# Patient Record
Sex: Female | Born: 1995
Health system: Southern US, Community
[De-identification: ages and names within clinical notes are randomized; demographics above are authoritative.]

## PROBLEM LIST (undated history)

## (undated) DIAGNOSIS — G43009 Migraine without aura, not intractable, without status migrainosus: Secondary | ICD-10-CM

## (undated) DIAGNOSIS — T753XXA Motion sickness, initial encounter: Secondary | ICD-10-CM

## (undated) DIAGNOSIS — L709 Acne, unspecified: Secondary | ICD-10-CM

## (undated) DIAGNOSIS — G44309 Post-traumatic headache, unspecified, not intractable: Secondary | ICD-10-CM

## (undated) HISTORY — DX: Acne, unspecified: L70.9

## (undated) HISTORY — PX: NO PAST SURGERIES: SHX2092

## (undated) HISTORY — DX: Migraine without aura, not intractable, without status migrainosus: G43.009

---
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
## 2014-03-01 ENCOUNTER — Encounter: Payer: Self-pay | Admitting: Family Medicine

## 2014-03-01 ENCOUNTER — Ambulatory Visit (INDEPENDENT_AMBULATORY_CARE_PROVIDER_SITE_OTHER): Payer: 59 | Admitting: Family Medicine

## 2014-03-01 VITALS — BP 118/78 | HR 89 | Temp 98.1°F | Ht 65.0 in | Wt 145.8 lb

## 2014-03-01 DIAGNOSIS — J0110 Acute frontal sinusitis, unspecified: Secondary | ICD-10-CM

## 2014-03-01 DIAGNOSIS — J302 Other seasonal allergic rhinitis: Secondary | ICD-10-CM

## 2014-03-01 DIAGNOSIS — G43009 Migraine without aura, not intractable, without status migrainosus: Secondary | ICD-10-CM | POA: Insufficient documentation

## 2014-03-01 DIAGNOSIS — F430 Acute stress reaction: Secondary | ICD-10-CM

## 2014-03-01 DIAGNOSIS — F410 Panic disorder [episodic paroxysmal anxiety] without agoraphobia: Secondary | ICD-10-CM

## 2014-03-01 HISTORY — DX: Migraine without aura, not intractable, without status migrainosus: G43.009

## 2014-03-01 MED ORDER — FLUCONAZOLE 150 MG PO TABS: 150.0000 mg | ORAL_TABLET | Freq: Once | ORAL | Status: DC

## 2014-03-01 MED ORDER — AMOXICILLIN-POT CLAVULANATE 875-125 MG PO TABS: 1.0000 | ORAL_TABLET | Freq: Two times a day (BID) | ORAL | Status: DC

## 2014-03-01 NOTE — Progress Notes (Signed)
Pre visit review using our clinic review tool, if applicable. No additional management support is needed unless otherwise documented below in the visit note. 

## 2014-03-01 NOTE — Progress Notes (Signed)
Dr. Karleen HampshireSpencer T. Tipton Ballow, MD, CAQ Sports Medicine [facility] Sports Medicine [address] [place], [id number] Phone: [phone] Fax: [phone]  03/01/2014  Patient: Julie CiproLeighann M Mckenzie, MRN: [id number], DOB: 08/16/1995, [age] y.o.  Primary Physician:  Kerby NoraAmy Bedsole, MD  Chief Complaint: Sinusitis and Ear Pain  Subjective:   Julie Mckenzie is a 18 y.o. very pleasant female patient who presents with the following:  [facility] [employer].  Sophomore.   [facility], and has gotten a lot of sinus infections and ear infections years ago and has had some bad eye paini and ear pain. Close to 2 months.  Missed 3 days in October. Ears were hurting really bad. Mainly frontal head pain.   Also on topamax for migraines.  Gets a little bit of school anxiety. A lot to do in a week, will get short of breath and panicky.   Not other times. No depression.  Past Medical History, Surgical History, Social History, Family History, Problem List, Medications, and Allergies have been reviewed and updated if relevant.  ROS: GEN: Acute illness details above GI: Tolerating PO intake GU: maintaining adequate hydration and urination Pulm: No SOB Interactive and getting along well at home.  Otherwise, ROS is as per the HPI.   Objective:   BP 118/78 mmHg  Pulse 89  Temp(Src) 98.1 F (36.7 C) (Oral)  Ht 5\' 5"  (1.651 m)  Wt 145 lb 12 oz (66.112 kg)  BMI 24.25 kg/m2  LMP 02/17/2014   Gen: WDWN, NAD; alert,appropriate and cooperative throughout exam  HEENT: Normocephalic and atraumatic. Throat clear, w/o exudate, no LAD, R TM clear, serous fluid, L TM - good landmarks, + fluid present. rhinnorhea.  Left frontal and maxillary sinuses: Tender, mild at front Right frontal and maxillary sinuses: Tender, mild at front  Neck: No ant or post LAD CV: RRR, No M/G/R Pulm: Breathing comfortably in no resp distress. no w/c/r Abd: S,NT,ND,+BS Extr: no c/c/e Psych: full  affect, pleasant    Laboratory and Imaging Data:  Assessment and Plan:   Acute frontal sinusitis, recurrence not specified  Seasonal allergies  Migraine without aura and without status migrainosus, not intractable  Panic attack as reaction to stress  With 3 months of intermittent symptoms, ALLERGIES, and intermittent cold symptoms, I think that it is reasonable with the frontal headache and pain in the frontal sinus reason to treat this.  Possible this could be a migraine variant.  ALLERGIES are Arty pretty well treated with Zyrtec and she is also use some steroids nasally.  She has had an occasional minor panic attack.  She wanted some advice about how to deal with these without medication.  We talked about regular exercise, regular routine aerobic exercise, some breathing techniques, and some other basic cognitive behavioral therapy techniques to deal with anxiety and panic attacks.  I do not think she has true panic disorder.  Follow-up: No Follow-up on file.  New Prescriptions   AMOXICILLIN-CLAVULANATE (AUGMENTIN) 875-125 MG PER TABLET    Take 1 tablet by mouth 2 (two) times daily.   FLUCONAZOLE (DIFLUCAN) 150 MG TABLET    Take 1 tablet (150 mg total) by mouth once. Repeat if needed   No orders of the defined types were placed in this encounter.    Signed,  Elpidio GaleaSpencer T. Alisah Grandberry, MD   Patient's Medications  New Prescriptions   AMOXICILLIN-CLAVULANATE (AUGMENTIN) 875-125 MG PER TABLET    Take 1 tablet by mouth 2 (  two) times daily.   FLUCONAZOLE (DIFLUCAN) 150 MG TABLET    Take 1 tablet (150 mg total) by mouth once. Repeat if needed  Previous Medications   CETIRIZINE (ZYRTEC) 10 MG TABLET    Take 10 mg by mouth daily.   IBUPROFEN (ADVIL,MOTRIN) 200 MG TABLET    Take 600 mg by mouth every 6 (six) hours as needed for pain.   NORGESTIMATE-ETHINYL ESTRADIOL (ORTHO-CYCLEN, 28,) 0.25-35 MG-MCG TABLET    TAKE 1 TABLET BY MOUTH DAILY   TOPIRAMATE (TOPAMAX) 25 MG CAPSULE    Take 3  capsules (75 mg total) by mouth 2 (two) times daily.  Modified Medications   No medications on file  Discontinued Medications   No medications on file

## 2014-05-06 ENCOUNTER — Other Ambulatory Visit: Payer: Self-pay

## 2014-05-06 MED ORDER — TOPIRAMATE 25 MG PO CPSP: 75.0000 mg | ORAL_CAPSULE | Freq: Two times a day (BID) | ORAL | Status: DC

## 2014-05-06 NOTE — Telephone Encounter (Signed)
pts mother left v/m requesting refill topamax for migraine h/as to [facility] [address] Pt has not seen neurologist in 2 years. pts last annual exam 08/27/13. Pt is out of med and pts mother will see her at college this weekend. Please advise.

## 2014-09-02 ENCOUNTER — Ambulatory Visit (INDEPENDENT_AMBULATORY_CARE_PROVIDER_SITE_OTHER): Payer: 59 | Admitting: Family Medicine

## 2014-09-02 ENCOUNTER — Ambulatory Visit (INDEPENDENT_AMBULATORY_CARE_PROVIDER_SITE_OTHER)
Admission: RE | Admit: 2014-09-02 | Discharge: 2014-09-02 | Disposition: A | Discharge status: Home / Self Care | Payer: 59 | Source: Ambulatory Visit | Attending: Family Medicine | Admitting: Family Medicine

## 2014-09-02 ENCOUNTER — Encounter: Payer: Self-pay | Admitting: Family Medicine

## 2014-09-02 VITALS — BP 124/80 | HR 80 | Temp 98.3°F | Ht 65.0 in | Wt 138.0 lb

## 2014-09-02 DIAGNOSIS — W19XXXA Unspecified fall, initial encounter: Secondary | ICD-10-CM | POA: Insufficient documentation

## 2014-09-02 DIAGNOSIS — M545 Low back pain, unspecified: Secondary | ICD-10-CM | POA: Insufficient documentation

## 2014-09-02 DIAGNOSIS — M533 Sacrococcygeal disorders, not elsewhere classified: Secondary | ICD-10-CM

## 2014-09-02 DIAGNOSIS — Y92099 Unspecified place in other non-institutional residence as the place of occurrence of the external cause: Secondary | ICD-10-CM | POA: Diagnosis not present

## 2014-09-02 DIAGNOSIS — Y92009 Unspecified place in unspecified non-institutional (private) residence as the place of occurrence of the external cause: Principal | ICD-10-CM

## 2014-09-02 NOTE — Patient Instructions (Signed)
We will call with x-ray results.  Sit on donut pillow to avoid pressure when able.  Start ibuprofen with food 800 mg every 8 hours for pain and inflammation.

## 2014-09-02 NOTE — Progress Notes (Signed)
Pre visit review using our clinic review tool, if applicable. No additional management support is needed unless otherwise documented below in the visit note. 

## 2014-09-02 NOTE — Progress Notes (Signed)
   Subjective:    Patient ID: Julie Mckenzie, female    DOB: 05/03/1995, 19 y.o.   MRN: [id number]  HPI  19 year old female presents following  Fall off folding lunge chair. She landed directly on her tailbone 3 days ago.  At first mild pain, since then she has had worsening pain.  Low back pain , left hip and both legs. Pain severe if try o sit directly on tailbone. Pain with moving around.  She has used ibuprofen 400 mg BID, helps minimally.  No bruising.   No numbness, no weakness in legs.   PMH: no back issues.    Review of Systems  Constitutional: Negative for fever and fatigue.  HENT: Negative for ear pain.   Eyes: Negative for pain.  Respiratory: Negative for chest tightness and shortness of breath.   Cardiovascular: Negative for chest pain, palpitations and leg swelling.  Gastrointestinal: Negative for abdominal pain.  Genitourinary: Negative for dysuria.       Objective:   Physical Exam  Constitutional: Vital signs are normal. She appears well-developed and well-nourished. She is cooperative.  Non-toxic appearance. She does not appear ill. No distress.  HENT:  Head: Normocephalic.  Right Ear: Hearing, tympanic membrane, external ear and ear canal normal. Tympanic membrane is not erythematous, not retracted and not bulging.  Left Ear: Hearing, tympanic membrane, external ear and ear canal normal. Tympanic membrane is not erythematous, not retracted and not bulging.  Nose: No mucosal edema or rhinorrhea. Right sinus exhibits no maxillary sinus tenderness and no frontal sinus tenderness. Left sinus exhibits no maxillary sinus tenderness and no frontal sinus tenderness.  Mouth/Throat: Uvula is midline, oropharynx is clear and moist and mucous membranes are normal.  Eyes: Conjunctivae, EOM and lids are normal. Pupils are equal, round, and reactive to light. Lids are everted and swept, no foreign bodies found.  Neck: Trachea normal and normal range of motion. Neck supple.  Carotid bruit is not present. No thyroid mass and no thyromegaly present.  Cardiovascular: Normal rate, regular rhythm, S1 normal, S2 normal, normal heart sounds, intact distal pulses and normal pulses.  Exam reveals no gallop and no friction rub.   No murmur heard. Pulmonary/Chest: Effort normal and breath sounds normal. No tachypnea. No respiratory distress. She has no decreased breath sounds. She has no wheezes. She has no rhonchi. She has no rales.  Abdominal: Soft. Normal appearance and bowel sounds are normal. There is no tenderness.  Musculoskeletal:       Right hip: Normal.       Left hip: Normal.       Cervical back: Normal. She exhibits normal range of motion.       Thoracic back: Normal.       Lumbar back: She exhibits tenderness. She exhibits normal range of motion.  ttp over coccyx, no other focal pain  Neurological: She is alert.  Skin: Skin is warm, dry and intact. No rash noted.  Psychiatric: Her speech is normal and behavior is normal. Judgment and thought content normal. Her mood appears not anxious. Cognition and memory are normal. She does not exhibit a depressed mood.          Assessment & Plan:

## 2014-09-05 ENCOUNTER — Other Ambulatory Visit: Payer: Self-pay | Admitting: Family Medicine

## 2014-09-06 NOTE — Telephone Encounter (Signed)
Pt left v/m requesting status of BC refill; left v/m per DPR that refill was sent to [facility] [address]

## 2014-09-06 NOTE — Telephone Encounter (Signed)
Last office visit 09/02/2014 for fall.  Last CPE 08/24/2012.  Last refilled 09/29/2013 for #28 with 11 refills.  Ok to refill?

## 2014-09-08 ENCOUNTER — Encounter: Payer: Self-pay | Admitting: Family Medicine

## 2014-09-08 NOTE — Assessment & Plan Note (Signed)
Given fall and low  Back pain. Eval with X-ray; no fracture seen.

## 2014-09-08 NOTE — Assessment & Plan Note (Signed)
Eval with X-ray.. Initial read showed no fracture. Use NSAIDs ., donought pillow and limit physical activity

## 2014-09-30 ENCOUNTER — Other Ambulatory Visit: Payer: Self-pay

## 2014-09-30 MED ORDER — TOPIRAMATE 25 MG PO CPSP: 75.0000 mg | ORAL_CAPSULE | Freq: Two times a day (BID) | ORAL | Status: DC

## 2014-09-30 MED ORDER — NORGESTIMATE-ETH ESTRADIOL 0.25-35 MG-MCG PO TABS: 1.0000 | ORAL_TABLET | Freq: Every day | ORAL | Status: DC

## 2014-09-30 NOTE — Telephone Encounter (Signed)
Pt left v/m requesting refill ortho cyclen and topamax until seen for CPX on 11/17/14.Please advise.

## 2014-10-01 ENCOUNTER — Other Ambulatory Visit: Payer: Self-pay | Admitting: Family Medicine

## 2014-11-17 ENCOUNTER — Ambulatory Visit (INDEPENDENT_AMBULATORY_CARE_PROVIDER_SITE_OTHER): Payer: 59 | Admitting: Family Medicine

## 2014-11-17 ENCOUNTER — Encounter: Payer: Self-pay | Admitting: Family Medicine

## 2014-11-17 VITALS — BP 124/70 | HR 61 | Temp 98.0°F | Ht 65.0 in | Wt 139.0 lb

## 2014-11-17 DIAGNOSIS — Z0000 Encounter for general adult medical examination without abnormal findings: Secondary | ICD-10-CM

## 2014-11-17 DIAGNOSIS — Z113 Encounter for screening for infections with a predominantly sexual mode of transmission: Secondary | ICD-10-CM | POA: Diagnosis not present

## 2014-11-17 MED ORDER — NORGESTIMATE-ETH ESTRADIOL 0.25-35 MG-MCG PO TABS: 1.0000 | ORAL_TABLET | Freq: Every day | ORAL | Status: DC

## 2014-11-17 MED ORDER — TOPIRAMATE 25 MG PO CPSP: 75.0000 mg | ORAL_CAPSULE | Freq: Every day | ORAL | Status: DC

## 2014-11-17 NOTE — Progress Notes (Signed)
   Subjective:    Patient ID: Julie Mckenzie, female    DOB: 1995/05/12, 19 y.o.   MRN: [id number]  HPI  The patient is here for annual wellness exam and preventative care.     She has concerns about being tested for STDs. She has one boyfriend in last year.  Over 1 year ago she was diagnosed and treated for chlamydia.  She is using orthocyclen, menses routine and no issues.  Migraine; well controlled on topiramate.  Acne, improved off accutane. Already on low androgenic SE OCP.   Diet:  Eating more healthy. More fruits and veggies. No fast food. Drink water. Uses [employer]itness Pal.  Exercise: 7 days a week walking/ running, weight lifting.     Review of Systems  Constitutional: Negative for fever and fatigue.  HENT: Negative for ear pain.   Eyes: Negative for pain.  Respiratory: Negative for cough and shortness of breath.   Cardiovascular: Negative for chest pain.  Gastrointestinal: Negative for abdominal pain.  Genitourinary: Negative for dysuria, vaginal pain, menstrual problem and pelvic pain.  Psychiatric/Behavioral: Negative for hallucinations, dysphoric mood and agitation. The patient is not nervous/anxious.    No vaginal ulcers.    Objective:   Physical Exam  Constitutional: Vital signs are normal. She appears well-developed and well-nourished. She is cooperative.  Non-toxic appearance. She does not appear ill. No distress.  HENT:  Head: Normocephalic.  Right Ear: Hearing, tympanic membrane, external ear and ear canal normal.  Left Ear: Hearing, tympanic membrane, external ear and ear canal normal.  Nose: Nose normal.  Eyes: Conjunctivae, EOM and lids are normal. Pupils are equal, round, and reactive to light. Lids are everted and swept, no foreign bodies found.  Neck: Trachea normal and normal range of motion. Neck supple. Carotid bruit is not present. No thyroid mass and no thyromegaly present.  Cardiovascular: Normal rate, regular rhythm, S1 normal, S2 normal,  normal heart sounds and intact distal pulses.  Exam reveals no gallop.   No murmur heard. Pulmonary/Chest: Effort normal and breath sounds normal. No respiratory distress. She has no wheezes. She has no rhonchi. She has no rales.  Abdominal: Soft. Normal appearance and bowel sounds are normal. She exhibits no distension, no fluid wave, no abdominal bruit and no mass. There is no hepatosplenomegaly. There is no tenderness. There is no rebound, no guarding and no CVA tenderness. No hernia.  Lymphadenopathy:    She has no cervical adenopathy.    She has no axillary adenopathy.  Neurological: She is alert. She has normal strength. No cranial nerve deficit or sensory deficit.  Skin: Skin is warm, dry and intact. No rash noted.  Psychiatric: Her speech is normal and behavior is normal. Judgment normal. Her mood appears not anxious. Cognition and memory are normal. She does not exhibit a depressed mood.          Assessment & Plan:  The patient's preventative maintenance and recommended screening tests for an annual wellness exam were reviewed in full today. Brought up to date unless services declined.  Counselled on the importance of diet, exercise, and its role in overall health and mortality. The patient's FH and SH was reviewed, including their home life, tobacco status, and drug and alcohol status.   Nonsmoker  STD testing: due  Immunizations: Uptodate. PAP/DVE:  Will start at age 2.

## 2014-11-17 NOTE — Addendum Note (Signed)
Addended by: Shon Millet on: 11/17/2014 02:35 PM   Modules accepted: Orders

## 2014-11-17 NOTE — Progress Notes (Signed)
Pre visit review using our clinic review tool, if applicable. No additional management support is needed unless otherwise documented below in the visit note. 

## 2014-11-17 NOTE — Patient Instructions (Signed)
Stop at lab on way out for testing.  Keep up great work on healthy eating and regular exercise.

## 2014-11-18 LAB — HEPATITIS PANEL, ACUTE
HCV Ab: NEGATIVE
HEP A IGM: NONREACTIVE
Hep B C IgM: NONREACTIVE
Hepatitis B Surface Ag: NEGATIVE

## 2014-11-18 LAB — GC/CHLAMYDIA PROBE AMP
CT Probe RNA: NEGATIVE
GC Probe RNA: NEGATIVE

## 2014-11-18 LAB — HIV ANTIBODY (ROUTINE TESTING W REFLEX): HIV 1&2 Ab, 4th Generation: NONREACTIVE

## 2014-11-19 LAB — RPR

## 2015-03-16 ENCOUNTER — Other Ambulatory Visit: Payer: Self-pay | Admitting: Family Medicine

## 2015-04-11 ENCOUNTER — Ambulatory Visit (INDEPENDENT_AMBULATORY_CARE_PROVIDER_SITE_OTHER): Payer: 59 | Admitting: Family Medicine

## 2015-04-11 ENCOUNTER — Encounter: Payer: Self-pay | Admitting: Family Medicine

## 2015-04-11 VITALS — BP 100/70 | HR 76 | Temp 98.4°F | Wt 138.8 lb

## 2015-04-11 DIAGNOSIS — F4329 Adjustment disorder with other symptoms: Secondary | ICD-10-CM | POA: Diagnosis not present

## 2015-04-11 DIAGNOSIS — G43009 Migraine without aura, not intractable, without status migrainosus: Secondary | ICD-10-CM | POA: Diagnosis not present

## 2015-04-11 MED ORDER — TOPIRAMATE 100 MG PO TABS: 100.0000 mg | ORAL_TABLET | Freq: Two times a day (BID) | ORAL | Status: DC

## 2015-04-11 MED ORDER — SUMATRIPTAN SUCCINATE 100 MG PO TABS: 100.0000 mg | ORAL_TABLET | ORAL | Status: DC | PRN

## 2015-04-11 NOTE — Progress Notes (Signed)
Pre visit review using our clinic review tool, if applicable. No additional management support is needed unless otherwise documented below in the visit note. 

## 2015-04-11 NOTE — Assessment & Plan Note (Signed)
Increase topiramate to 100 mg daily. Keep headache diary. Continue healthy lifestyle. Sumatriptan for acute treatment.   Given stress trigger.. Work on stress reduction.

## 2015-04-11 NOTE — Patient Instructions (Signed)
Increase Topamax to 100 mg tab daily.  Use sumatriptan for acute treatment.  Keep a headache diary.  Work on stress reduction, relaxation.  Follow up in 1 month for migraines and mood.

## 2015-04-11 NOTE — Progress Notes (Signed)
Subjective:    Patient ID: Julie Mckenzie, female    DOB: [birth year], 20 y.o.   MRN: [id number]  HPI  20 year old female presents to discuss migraines.   She reports she  Is currently taking Topamax 75 mg daily for prevention.  She has an increase in stress and anxiety at school the last semester.  She has had more severe and more frequent headaches.  Occurring once a week, lasting 1-2 days.  She works out every day, eats healthy, lots a of water. Good sleep.  With headache gets a lot of neck pain and tension. Has sensitivity to light and sound, Mild nausea.   Wt Readings from Last 3 Encounters:  04/11/15 138 lb 12 oz (62.937 kg)  11/17/14 139 lb (63.05 kg)  09/02/14 138 lb (62.596 kg)   GAD 7; score 6 , somewhat difficult.  Seen counselor.     Review of Systems  Constitutional: Negative for fever and fatigue.  HENT: Negative for ear pain.   Eyes: Negative for pain.  Respiratory: Negative for chest tightness and shortness of breath.   Cardiovascular: Negative for chest pain, palpitations and leg swelling.  Gastrointestinal: Negative for abdominal pain.  Genitourinary: Negative for dysuria.       Objective:   Physical Exam  Constitutional: She is oriented to person, place, and time. Vital signs are normal. She appears well-developed and well-nourished. She is cooperative.  Non-toxic appearance. She does not appear ill. No distress.  HENT:  Head: Normocephalic.  Right Ear: Hearing, tympanic membrane, external ear and ear canal normal. Tympanic membrane is not erythematous, not retracted and not bulging.  Left Ear: Hearing, tympanic membrane, external ear and ear canal normal. Tympanic membrane is not erythematous, not retracted and not bulging.  Nose: No mucosal edema or rhinorrhea. Right sinus exhibits no maxillary sinus tenderness and no frontal sinus tenderness. Left sinus exhibits no maxillary sinus tenderness and no frontal sinus tenderness.  Mouth/Throat: Uvula  is midline, oropharynx is clear and moist and mucous membranes are normal.  Eyes: Conjunctivae, EOM and lids are normal. Pupils are equal, round, and reactive to light. Lids are everted and swept, no foreign bodies found.  Neck: Trachea normal and normal range of motion. Neck supple. Carotid bruit is not present. No thyroid mass and no thyromegaly present.  Cardiovascular: Normal rate, regular rhythm, S1 normal, S2 normal, normal heart sounds, intact distal pulses and normal pulses.  Exam reveals no gallop and no friction rub.   No murmur heard. Pulmonary/Chest: Effort normal and breath sounds normal. No tachypnea. No respiratory distress. She has no decreased breath sounds. She has no wheezes. She has no rhonchi. She has no rales.  Abdominal: Soft. Normal appearance and bowel sounds are normal. There is no tenderness.  Neurological: She is alert and oriented to person, place, and time. She has normal strength and normal reflexes. No cranial nerve deficit or sensory deficit. She exhibits normal muscle tone. She displays a negative Romberg sign. Coordination and gait normal. GCS eye subscore is 4. GCS verbal subscore is 5. GCS motor subscore is 6.  Nml cerebellar exam   No papilledema  Skin: Skin is warm, dry and intact. No rash noted.  Psychiatric: She has a normal mood and affect. Her speech is normal and behavior is normal. Judgment and thought content normal. Her mood appears not anxious. Cognition and memory are normal. Cognition and memory are not impaired. She does not exhibit a depressed mood. She exhibits normal recent memory  and normal remote memory.          Assessment & Plan:

## 2015-04-11 NOTE — Assessment & Plan Note (Signed)
GAD score fairly low at 6.  Work on relaxation and stress reduction. Follow up in 1 month.

## 2015-05-02 ENCOUNTER — Telehealth: Payer: Self-pay | Admitting: Family Medicine

## 2015-05-02 NOTE — Telephone Encounter (Signed)
[facility] Day - Client TELEPHONE ADVICE RECORD [facility] Patient Name: Julie Mckenzie DOB: [birth year] Initial Comment Caller states c/o abdominal cramping, spotting - thinks symptoms may be related to increase in Topamax Nurse Assessment Nurse: Roderic Ovens, RN, Amy Date/Time (Eastern Time): 05/02/2015 1:29:19 PM Confirm and document reason for call. If symptomatic, describe symptoms. You must click the next button to save text entered. ---SHE STATES THAT SHE HAD SOME SPOTTING THAT STARTED ON FRIDAY. THEN SHE ALSO HAD SOME SPOTTING TODAY. FRIDAY AND SAT THE SPOTTING WAS JUST ON THE TOILET PAPER. SAT A PANTY LINER AND TODAY A LIGHT TAMPON. CRAMPING IS PERIOD CRAMPING THAT SHE HAS EVERY MONTH. SHE WOULD USUALLY GET HER PERIOD ABOUT 10 DAYS FROM NOW. SHE IS ON BCP. TOPAMAX WAS INCR FROM 75 TO 100 MG AND SHE IS NOT SURE IF THAT MAY BE A SIDE EFFECT. SHE STATES THE INCR WAS AROUND 4TH OF JAN. Has the patient traveled out of the country within the last 30 days? ---Not Applicable Does the patient have any new or worsening symptoms? ---Yes Will a triage be completed? ---Yes Related visit to physician within the last 2 weeks? ---No Does the PT have any chronic conditions? (i.e. diabetes, asthma, etc.) ---Yes List chronic conditions. ---MIGRAINES Did the patient indicate they were pregnant? ---No Is this a behavioral health or substance abuse call? ---No Guidelines Guideline Title Affirmed Question Affirmed Notes Abdominal Pain - Menstrual Cramps Normal menstrual cramps (all triage questions negative) Final Disposition User See Physician within 24 Hours Cylinder, Charity fundraiser, Amy Comments PATIENT IS EXTREMELY ANXIOUS ABOUT HAVING THIS BLEEDING NOW. SHE STATES THAT EVERYTHING LINES UP WITH A NORMAL PERIOD FOR HER. SHE STATES THAT HER CRAMPING IS IN HER BACK WHERE SHE NORMALLY GETS IT. HER BLEEDING IS NO MORE THAN ENOUGH FOR A PANTY LINER. SHE STATES THAT  SHE SHOULD ACCORDING TO HER BCP'S WILL START IN 10 DAYS. SHE HAS NOT BEEN ON ANY OTHER MEDICATIONS SHE STATES OTHER THAN THE IMITREX PLEASE NOTE: All timestamps contained within this report are represented as [place] Standard Time. CONFIDENTIALTY NOTICE: This fax transmission is intended only for the addressee. It contains information that is legally privileged, confidential or otherwise protected from use or disclosure. If you are not the intended recipient, you are strictly prohibited from reviewing, disclosing, copying using or disseminating any of this information or taking any action in reliance on or regarding this information. If you have received this fax in error, please notify us immediately by telephone so that we can arrange for its return to [place]. Phone: [phone], Toll-Free: [phone], Fax: [phone] Page: 2 of 2 Call Id: [id number] Comments AND THE TOPAMAX. SHE IS JUST CONSUMED WITH THE IDEA THAT THESE MEDICATIONS HAVE CAUSED HER TO HAVE THE CHANGES IN HER PERIOD. DID LOOK OVER THE S/E'S OF THE MEDICATIONS AND IT DID SHOW THAT THERE COULD BE SOME CHANGES TO MENSTRUAL CYCLE. DID INFORM HER THAT IS RARE. SHE DID FEEL LIKE SHE COULD BE PREGNANT, NURSE ENCOURAGED HER TO DO PREGNANCY TEST THEN, SHE STATES THAT SHE DID ONE ON FRIDAY. SHE STATES IT WAS THE FIRST SPECIMEN OF THE MORNING AND IT WAS NEGATIVE. SHE IS AT SCHOOL AN HOUR AWAY AND STATES THAT IT WOULD BE HARD FOR HER TO COME IN FOR AN APPT. Referrals REFERRED TO PCP OFFICE REFERRED TO PCP OFFICE Disagree/Comply: Comply

## 2015-05-02 NOTE — Telephone Encounter (Signed)
I agree rare SE from topamax.  I would wear panty liner and and follow . If menstrual irregularity continue after 1 week, recheck upreg. If continueing after this consider returning to previous dose

## 2015-05-03 NOTE — Telephone Encounter (Signed)
Pt called back, returning your call. You can reach her at [phone]

## 2015-05-03 NOTE — Telephone Encounter (Signed)
Left message for Carrigan to return my call.

## 2015-05-03 NOTE — Telephone Encounter (Signed)
Seher notified as instructed by telephone.  She will keep [place] updated.

## 2015-05-18 ENCOUNTER — Ambulatory Visit (INDEPENDENT_AMBULATORY_CARE_PROVIDER_SITE_OTHER): Payer: 59 | Admitting: Family Medicine

## 2015-05-18 ENCOUNTER — Encounter: Payer: Self-pay | Admitting: Family Medicine

## 2015-05-18 VITALS — BP 130/78 | HR 74 | Temp 98.5°F | Ht 65.0 in | Wt 140.0 lb

## 2015-05-18 DIAGNOSIS — G43009 Migraine without aura, not intractable, without status migrainosus: Secondary | ICD-10-CM | POA: Diagnosis not present

## 2015-05-18 DIAGNOSIS — N939 Abnormal uterine and vaginal bleeding, unspecified: Secondary | ICD-10-CM | POA: Diagnosis not present

## 2015-05-18 DIAGNOSIS — F4329 Adjustment disorder with other symptoms: Secondary | ICD-10-CM

## 2015-05-18 DIAGNOSIS — N923 Ovulation bleeding: Secondary | ICD-10-CM | POA: Insufficient documentation

## 2015-05-18 NOTE — Patient Instructions (Signed)
Talk with GYN for  possible IUD or birth control change. Continue topamax and use condom as back up. Continue working on stress reduction and relaxation methods.

## 2015-05-18 NOTE — Assessment & Plan Note (Signed)
Resolved with decreased stress at school.

## 2015-05-18 NOTE — Assessment & Plan Note (Signed)
May be due to increased dose of topamax. Interaction between OCP and topamax decreasing effectiveness of progestin.  Pt to discuss IUD, maybe copper would be needed with GYN.

## 2015-05-18 NOTE — Progress Notes (Signed)
Pre visit review using our clinic review tool, if applicable. No additional management support is needed unless otherwise documented below in the visit note. 

## 2015-05-18 NOTE — Progress Notes (Signed)
   Subjective:    Patient ID: Julie Mckenzie, female    DOB: [birth year], 20 y.o.   MRN: [id number]  HPI 20 year old female presents to follow up migraines and stress and adjustment reaction.   At last OV 1 month ago her topamax was increased to 100 mg daily.  Today she reports migraines occuring only once in last month.  She only had to take imitrex once.  Had some breakthrough bleeding but this resolved into her menses.. No other SE.  She is on orthocyclen.28.  She is interested in  IUD placement to avoid decreased effectiveness of birth control. She did several neg preg tests.   Last GAD 7:  6, somewhat difficult.  Today GAD 7: 3, not difficult at all.  She is now feeling less overwhelmed, less stress overall.  Recommended stress reduction and relaxation. Pt had been seeing counselor.    Review of Systems  Constitutional: Negative for fever and fatigue.  HENT: Negative for ear pain.   Eyes: Negative for pain.  Respiratory: Negative for chest tightness and shortness of breath.   Cardiovascular: Negative for chest pain, palpitations and leg swelling.  Gastrointestinal: Negative for abdominal pain.  Genitourinary: Negative for dysuria.       Objective:   Physical Exam  Constitutional: Vital signs are normal. She appears well-developed and well-nourished. She is cooperative.  Non-toxic appearance. She does not appear ill. No distress.  HENT:  Head: Normocephalic.  Right Ear: Hearing, tympanic membrane, external ear and ear canal normal. Tympanic membrane is not erythematous, not retracted and not bulging.  Left Ear: Hearing, tympanic membrane, external ear and ear canal normal. Tympanic membrane is not erythematous, not retracted and not bulging.  Nose: No mucosal edema or rhinorrhea. Right sinus exhibits no maxillary sinus tenderness and no frontal sinus tenderness. Left sinus exhibits no maxillary sinus tenderness and no frontal sinus tenderness.  Mouth/Throat: Uvula is  midline, oropharynx is clear and moist and mucous membranes are normal.  Eyes: Conjunctivae, EOM and lids are normal. Pupils are equal, round, and reactive to light. Lids are everted and swept, no foreign bodies found.  Neck: Trachea normal and normal range of motion. Neck supple. Carotid bruit is not present. No thyroid mass and no thyromegaly present.  Cardiovascular: Normal rate, regular rhythm, S1 normal, S2 normal, normal heart sounds, intact distal pulses and normal pulses.  Exam reveals no gallop and no friction rub.   No murmur heard. Pulmonary/Chest: Effort normal and breath sounds normal. No tachypnea. No respiratory distress. She has no decreased breath sounds. She has no wheezes. She has no rhonchi. She has no rales.  Abdominal: Soft. Normal appearance and bowel sounds are normal. There is no tenderness.  Neurological: She is alert.  Skin: Skin is warm, dry and intact. No rash noted.  Psychiatric: Her speech is normal and behavior is normal. Judgment and thought content normal. Her mood appears not anxious. Cognition and memory are normal. She does not exhibit a depressed mood.          Assessment & Plan:

## 2015-05-18 NOTE — Assessment & Plan Note (Signed)
Improved control on topamax. Continue higher dose. USe condoms as back up.

## 2015-08-25 ENCOUNTER — Encounter: Payer: Self-pay | Admitting: Family Medicine

## 2015-08-25 ENCOUNTER — Ambulatory Visit (INDEPENDENT_AMBULATORY_CARE_PROVIDER_SITE_OTHER): Payer: 59 | Admitting: Family Medicine

## 2015-08-25 VITALS — BP 127/84 | HR 65 | Temp 98.3°F | Ht 65.0 in | Wt 143.2 lb

## 2015-08-25 DIAGNOSIS — G43009 Migraine without aura, not intractable, without status migrainosus: Secondary | ICD-10-CM

## 2015-08-25 DIAGNOSIS — Z3009 Encounter for other general counseling and advice on contraception: Secondary | ICD-10-CM | POA: Diagnosis not present

## 2015-08-25 MED ORDER — TOPIRAMATE 100 MG PO TABS: 100.0000 mg | ORAL_TABLET | Freq: Every day | ORAL | Status: DC

## 2015-08-25 MED ORDER — NORGESTIMATE-ETH ESTRADIOL 0.25-35 MG-MCG PO TABS: 1.0000 | ORAL_TABLET | Freq: Every day | ORAL | Status: DC

## 2015-08-25 MED ORDER — SUMATRIPTAN SUCCINATE 100 MG PO TABS: 100.0000 mg | ORAL_TABLET | ORAL | Status: DC | PRN

## 2015-08-25 NOTE — Assessment & Plan Note (Signed)
Stable control on topamax, using ibuprofen and imitrex prn.

## 2015-08-25 NOTE — Progress Notes (Signed)
   Subjective:    Patient ID: Julie Mckenzie, female    DOB: 12/19/1995, 20 y.o.   MRN: [id number]  HPI  20 year old female presents prior to a 32 day trip to [place] with study abroad.  She needs medication refills of her  Migraine meds.  She reports migraines are well controlled on topamax 100 mg daily. Occuring 1-2 times a month. Usually uses ibuprofen. No SE to topamax.  Using imitrex once a month.   Wt Readings from Last 3 Encounters:  08/25/15 143 lb 4 oz (64.978 kg)  05/18/15 140 lb (63.504 kg)  04/11/15 138 lb 12 oz (62.937 kg)   She needs 2 packs of OCP filled at once to take with her. NO SE.  She looked on cdc website to see if any vaccines needed. Hep A?   Review of Systems  Constitutional: Negative for fever and fatigue.  HENT: Negative for ear pain.   Eyes: Negative for pain.  Respiratory: Negative for chest tightness and shortness of breath.   Cardiovascular: Negative for chest pain, palpitations and leg swelling.  Gastrointestinal: Negative for abdominal pain.  Genitourinary: Negative for dysuria.       Objective:   Physical Exam  Constitutional: Vital signs are normal. She appears well-developed and well-nourished. She is cooperative.  Non-toxic appearance. She does not appear ill. No distress.  HENT:  Head: Normocephalic.  Right Ear: Hearing, tympanic membrane, external ear and ear canal normal. Tympanic membrane is not erythematous, not retracted and not bulging.  Left Ear: Hearing, tympanic membrane, external ear and ear canal normal. Tympanic membrane is not erythematous, not retracted and not bulging.  Nose: No mucosal edema or rhinorrhea. Right sinus exhibits no maxillary sinus tenderness and no frontal sinus tenderness. Left sinus exhibits no maxillary sinus tenderness and no frontal sinus tenderness.  Mouth/Throat: Uvula is midline, oropharynx is clear and moist and mucous membranes are normal.  Eyes: Conjunctivae, EOM and lids are normal. Pupils are  equal, round, and reactive to light. Lids are everted and swept, no foreign bodies found.  Neck: Trachea normal and normal range of motion. Neck supple. Carotid bruit is not present. No thyroid mass and no thyromegaly present.  Cardiovascular: Normal rate, regular rhythm, S1 normal, S2 normal, normal heart sounds, intact distal pulses and normal pulses.  Exam reveals no gallop and no friction rub.   No murmur heard. Pulmonary/Chest: Effort normal and breath sounds normal. No tachypnea. No respiratory distress. She has no decreased breath sounds. She has no wheezes. She has no rhonchi. She has no rales.  Abdominal: Soft. Normal appearance and bowel sounds are normal. There is no tenderness.  Neurological: She is alert.  Skin: Skin is warm, dry and intact. No rash noted.  Psychiatric: Her speech is normal and behavior is normal. Judgment and thought content normal. Her mood appears not anxious. Cognition and memory are normal. She does not exhibit a depressed mood.          Assessment & Plan:

## 2015-08-25 NOTE — Progress Notes (Signed)
Pre visit review using our clinic review tool, if applicable. No additional management support is needed unless otherwise documented below in the visit note. 

## 2015-09-05 ENCOUNTER — Other Ambulatory Visit: Payer: Self-pay | Admitting: Family Medicine

## 2015-12-19 ENCOUNTER — Ambulatory Visit (INDEPENDENT_AMBULATORY_CARE_PROVIDER_SITE_OTHER): Payer: 59 | Admitting: Family Medicine

## 2015-12-19 ENCOUNTER — Encounter: Payer: Self-pay | Admitting: Family Medicine

## 2015-12-19 VITALS — BP 120/76 | HR 86 | Temp 99.1°F | Ht 67.75 in | Wt 152.5 lb

## 2015-12-19 DIAGNOSIS — Z23 Encounter for immunization: Secondary | ICD-10-CM

## 2015-12-19 DIAGNOSIS — T148 Other injury of unspecified body region: Secondary | ICD-10-CM | POA: Diagnosis not present

## 2015-12-19 DIAGNOSIS — R0789 Other chest pain: Secondary | ICD-10-CM | POA: Insufficient documentation

## 2015-12-19 DIAGNOSIS — F411 Generalized anxiety disorder: Secondary | ICD-10-CM | POA: Diagnosis not present

## 2015-12-19 DIAGNOSIS — T148XXA Other injury of unspecified body region, initial encounter: Secondary | ICD-10-CM | POA: Insufficient documentation

## 2015-12-19 MED ORDER — SERTRALINE HCL 50 MG PO TABS: 50.0000 mg | ORAL_TABLET | Freq: Every day | ORAL | 3 refills | Status: DC

## 2015-12-19 NOTE — Assessment & Plan Note (Signed)
Ice, tylenol for pain, heals with time, pt reassured.

## 2015-12-19 NOTE — Progress Notes (Signed)
Subjective:    Patient ID: Julie Mckenzie, female    DOB: Dec 22, 1995, 20 y.o.   MRN: [id number]  Anxiety  Symptoms include nervous/anxious behavior. Patient reports no chest pain, decreased concentration, shortness of breath or suicidal ideas.    Fall  Associated symptoms include headaches. Pertinent negatives include no hematuria.    20 year old female presents for new onset anxiety.  She reports she has always been the anxious type.  Never been on meds for this in past.  He has been feeling more anxious lately, worse in last few years since she has been in college.. School has been overwhelming.  She is interested in Veterinary surgeon in [place]. She has been to Hospital doctor at [facility] before, was helpful. Occ has strange sensation in chest tightness occurs when more anxious. Occur sometimes during homework. Ongoing off and on in last 4 years.  excess worrying.  Trouble relaxing. More irritable lately. Occ last all day. Occ heart skipping a beat. Sleeping fairly well at night. No depression, anhedonia.  No SI, no hallucinations.  GAD7: 10,   She slipped down stairs 1 week ago. Lost balance and slipped on hail, hit left lower back upper hip.  Area is very tender. There is a lump, possibly increasing in size  Review of Systems  Constitutional: Negative for fatigue.  Respiratory: Positive for chest tightness. Negative for cough and shortness of breath.   Cardiovascular: Negative for chest pain and leg swelling.  Genitourinary: Negative for hematuria, menstrual problem and vaginal bleeding.  Neurological: Positive for headaches.  Psychiatric/Behavioral: Positive for agitation. Negative for decreased concentration, dysphoric mood, self-injury, sleep disturbance and suicidal ideas. The patient is nervous/anxious and is hyperactive.        Objective:   Physical Exam  Constitutional: Vital signs are normal. She appears well-developed and well-nourished. She is cooperative.   Non-toxic appearance. She does not appear ill. No distress.  HENT:  Head: Normocephalic.  Right Ear: Hearing, tympanic membrane, external ear and ear canal normal. Tympanic membrane is not erythematous, not retracted and not bulging.  Left Ear: Hearing, tympanic membrane, external ear and ear canal normal. Tympanic membrane is not erythematous, not retracted and not bulging.  Nose: No mucosal edema or rhinorrhea. Right sinus exhibits no maxillary sinus tenderness and no frontal sinus tenderness. Left sinus exhibits no maxillary sinus tenderness and no frontal sinus tenderness.  Mouth/Throat: Uvula is midline, oropharynx is clear and moist and mucous membranes are normal.  Eyes: Conjunctivae, EOM and lids are normal. Pupils are equal, round, and reactive to light. Lids are everted and swept, no foreign bodies found.  Neck: Trachea normal and normal range of motion. Neck supple. Carotid bruit is not present. No thyroid mass and no thyromegaly present.  Cardiovascular: Normal rate, regular rhythm, S1 normal, S2 normal, normal heart sounds, intact distal pulses and normal pulses.  Exam reveals no gallop and no friction rub.   No murmur heard. Pulmonary/Chest: Effort normal and breath sounds normal. No tachypnea. No respiratory distress. She has no decreased breath sounds. She has no wheezes. She has no rhonchi. She has no rales.  Abdominal: Soft. Normal appearance and bowel sounds are normal. There is no tenderness.  Neurological: She is alert.  Skin: Skin is warm, dry and intact. No rash noted.  Psychiatric: Her speech is normal and behavior is normal. Judgment and thought content normal. Her mood appears not anxious. Cognition and memory are normal. She does not exhibit a depressed mood.   Hematoma (  small) and bruise over left buttock       Assessment & Plan:

## 2015-12-19 NOTE — Progress Notes (Signed)
Pre visit review using our clinic review tool, if applicable. No additional management support is needed unless otherwise documented below in the visit note. 

## 2015-12-19 NOTE — Assessment & Plan Note (Signed)
Refer to counselor.  Start sertraline 50 mg daily, close follow up in 4 weeks.

## 2015-12-19 NOTE — Assessment & Plan Note (Signed)
Likely secondary to anxiety, panic attacks. EKG nsr, pt feels fine otherwise.  No S/S of infeciton, anxiety etc.

## 2015-12-19 NOTE — Patient Instructions (Addendum)
Start 50 mg daily of sertraline at bedtime.  Stop at front desk for referral to counselor.

## 2016-01-16 ENCOUNTER — Encounter: Payer: Self-pay | Admitting: Family Medicine

## 2016-01-16 ENCOUNTER — Ambulatory Visit (INDEPENDENT_AMBULATORY_CARE_PROVIDER_SITE_OTHER): Payer: 59 | Admitting: Family Medicine

## 2016-01-16 DIAGNOSIS — F411 Generalized anxiety disorder: Secondary | ICD-10-CM | POA: Diagnosis not present

## 2016-01-16 NOTE — Progress Notes (Signed)
Pre visit review using our clinic review tool, if applicable. No additional management support is needed unless otherwise documented below in the visit note. 

## 2016-01-16 NOTE — Assessment & Plan Note (Signed)
Significant improvement in mood on sertrlaine. Mild SE , continue current dose.  Follow up 6 months.

## 2016-01-16 NOTE — Progress Notes (Signed)
   Subjective:    Patient ID: Julie Mckenzie, female    DOB: 10/01/1995, 20 y.o.   MRN: [id number]  HPI   20 year old female presents for follow up GAD.  At last OC 1 month ago started  on sertraline 50 mg daily.   Today she reports the medication causes some fatigue, mild. Taking at night. Seems like it is improving with time.  She has noted some decrease in memory, forgetting more often. Has helped significantly with her mood. No further panic attacks, sleeping at night well. She has noted 80% improvement overall.  She is managing better at school overall.   PHQ9: 6  Down form 12  GAD7: 1 down from 6  Plans to see counselor in [place].   Review of Systems  Constitutional: Negative for fatigue and fever.  HENT: Negative for ear pain.   Eyes: Negative for pain.  Respiratory: Negative for chest tightness and shortness of breath.   Cardiovascular: Negative for chest pain, palpitations and leg swelling.  Gastrointestinal: Negative for abdominal pain.  Genitourinary: Negative for dysuria.       Objective:   Physical Exam  Constitutional: Vital signs are normal. She appears well-developed and well-nourished. She is cooperative.  Non-toxic appearance. She does not appear ill. No distress.  HENT:  Head: Normocephalic.  Right Ear: Hearing, tympanic membrane, external ear and ear canal normal. Tympanic membrane is not erythematous, not retracted and not bulging.  Left Ear: Hearing, tympanic membrane, external ear and ear canal normal. Tympanic membrane is not erythematous, not retracted and not bulging.  Nose: No mucosal edema or rhinorrhea. Right sinus exhibits no maxillary sinus tenderness and no frontal sinus tenderness. Left sinus exhibits no maxillary sinus tenderness and no frontal sinus tenderness.  Mouth/Throat: Uvula is midline, oropharynx is clear and moist and mucous membranes are normal.  Eyes: Conjunctivae, EOM and lids are normal. Pupils are equal, round, and reactive  to light. Lids are everted and swept, no foreign bodies found.  Neck: Trachea normal and normal range of motion. Neck supple. Carotid bruit is not present. No thyroid mass and no thyromegaly present.  Cardiovascular: Normal rate, regular rhythm, S1 normal, S2 normal, normal heart sounds, intact distal pulses and normal pulses.  Exam reveals no gallop and no friction rub.   No murmur heard. Pulmonary/Chest: Effort normal and breath sounds normal. No tachypnea. No respiratory distress. She has no decreased breath sounds. She has no wheezes. She has no rhonchi. She has no rales.  Abdominal: Soft. Normal appearance and bowel sounds are normal. There is no tenderness.  Neurological: She is alert.  Skin: Skin is warm, dry and intact. No rash noted.  Psychiatric: Her speech is normal and behavior is normal. Judgment and thought content normal. Her mood appears not anxious. Cognition and memory are normal. She does not exhibit a depressed mood.          Assessment & Plan:

## 2016-01-16 NOTE — Patient Instructions (Addendum)
Continue on current dose of sertraline.  Keep working on behvoiral techniques for stress management.

## 2016-01-18 ENCOUNTER — Telehealth: Payer: Self-pay | Admitting: Family Medicine

## 2016-01-18 NOTE — Telephone Encounter (Signed)
9/12-Spoke to pt. She stated she is going to get her parents to help her find someone in [place]. [facility]. I will call pt back in 1 month to see who she has made her appt with.  10/12-Spoke to pt. She and her parents have not found anyone in [place] to est with. Pt is open to driving to [facility] to meet with Bambi Cottle or Dr. Laymond PurserPerrin. Her referral is placed on [facility] and BH # given to her for when she is ready to schedule.

## 2016-05-07 ENCOUNTER — Other Ambulatory Visit: Payer: Self-pay

## 2016-05-07 MED ORDER — SUMATRIPTAN SUCCINATE 100 MG PO TABS: 100.0000 mg | ORAL_TABLET | ORAL | 0 refills | Status: DC | PRN

## 2016-05-07 NOTE — Telephone Encounter (Signed)
Pt left v/m requesting refill [facility]atriptan to [facility] in [place]. Last seen 01/16/16.no cb needed. Refilled per protocol.

## 2016-05-27 ENCOUNTER — Other Ambulatory Visit: Payer: Self-pay | Admitting: Family Medicine

## 2016-06-18 ENCOUNTER — Telehealth: Payer: Self-pay

## 2016-06-18 NOTE — Telephone Encounter (Signed)
Sande notified by telephone that Dr. Ermalene SearingBedsole would like her to schedule office visit to discuss medications/side effects.  Benedetto Julie Mckenzie was currently driving but states she will call back to schedule office visit.

## 2016-06-18 NOTE — Telephone Encounter (Signed)
Complicated questions.. Needs appt to discuss if able. I am aware she is away at school.

## 2016-06-18 NOTE — Telephone Encounter (Signed)
Pt left v/m; pt has been taking Zoloft for anxiety and pt is having problems with hair loss and not sleeping a lot. Pt wants to know if could be a side effect from Zoloft; pt wonders if should wein off zoloft and try different med for anxiety. Pt also taking topiramate due to more often migraines and pt wants to know if should try different med for migraines or increase topiramate. Pt last seen 01/16/16. Should pt be seen or what to do. [facility] at [facility] . Pt request cb.

## 2016-07-12 ENCOUNTER — Ambulatory Visit (INDEPENDENT_AMBULATORY_CARE_PROVIDER_SITE_OTHER): Payer: 59 | Admitting: Family Medicine

## 2016-07-12 ENCOUNTER — Other Ambulatory Visit: Payer: Self-pay | Admitting: Family Medicine

## 2016-07-12 ENCOUNTER — Encounter: Payer: Self-pay | Admitting: Family Medicine

## 2016-07-12 VITALS — BP 114/80 | HR 69 | Temp 98.4°F | Ht 67.75 in | Wt 167.2 lb

## 2016-07-12 DIAGNOSIS — G43009 Migraine without aura, not intractable, without status migrainosus: Secondary | ICD-10-CM

## 2016-07-12 DIAGNOSIS — L659 Nonscarring hair loss, unspecified: Secondary | ICD-10-CM | POA: Diagnosis not present

## 2016-07-12 DIAGNOSIS — Z79899 Other long term (current) drug therapy: Secondary | ICD-10-CM | POA: Diagnosis not present

## 2016-07-12 DIAGNOSIS — F411 Generalized anxiety disorder: Secondary | ICD-10-CM | POA: Diagnosis not present

## 2016-07-12 LAB — POCT URINE PREGNANCY: Preg Test, Ur: NEGATIVE

## 2016-07-12 MED ORDER — TOPIRAMATE 50 MG PO TABS: 150.0000 mg | ORAL_TABLET | Freq: Every day | ORAL | 3 refills | Status: DC

## 2016-07-12 MED ORDER — SERTRALINE HCL 25 MG PO TABS: 25.0000 mg | ORAL_TABLET | Freq: Every day | ORAL | 1 refills | Status: DC

## 2016-07-12 NOTE — Progress Notes (Addendum)
   Subjective:    Patient ID: Julie Mckenzie, female    DOB: 13-Sep-1995, 21 y.o.   MRN: [id number]  HPI    21 year old female presents for follow up migraines.  She is on topamax 100 mg daily.  She has had increase in frequency during the school year.. Occurring  2 times a week. Headache, upset stomach.  Topamax also can cause hair loss.   She is also on accutane which can cause hair loss.   She has also noted alopecia and wonders if it is SE to zoloft she is on for GAD. Uptodate lists this as < 2% reported side effect. She has noted thinning of hair.. Ongoing x 5-6 months. Increased falling out in last few months. No rash, scalp.   She has had improvement in  Mood in last 8 months, but has also had some weigt gain.   Review of Systems  Constitutional: Negative for fatigue and fever.  HENT: Negative for ear pain.   Eyes: Negative for pain.  Respiratory: Negative for chest tightness and shortness of breath.   Cardiovascular: Negative for chest pain, palpitations and leg swelling.  Gastrointestinal: Negative for abdominal pain.  Genitourinary: Negative for dysuria.       Objective:   Physical Exam  Constitutional: Vital signs are normal. She appears well-developed and well-nourished. She is cooperative.  Non-toxic appearance. She does not appear ill. No distress.  HENT:  Head: Normocephalic.  Right Ear: Hearing, tympanic membrane, external ear and ear canal normal. Tympanic membrane is not erythematous, not retracted and not bulging.  Left Ear: Hearing, tympanic membrane, external ear and ear canal normal. Tympanic membrane is not erythematous, not retracted and not bulging.  Nose: No mucosal edema or rhinorrhea. Right sinus exhibits no maxillary sinus tenderness and no frontal sinus tenderness. Left sinus exhibits no maxillary sinus tenderness and no frontal sinus tenderness.  Mouth/Throat: Uvula is midline, oropharynx is clear and moist and mucous membranes are normal.    Eyes: Conjunctivae, EOM and lids are normal. Pupils are equal, round, and reactive to light. Lids are everted and swept, no foreign bodies found.  Neck: Trachea normal and normal range of motion. Neck supple. Carotid bruit is not present. No thyroid mass and no thyromegaly present.  Cardiovascular: Normal rate, regular rhythm, S1 normal, S2 normal, normal heart sounds, intact distal pulses and normal pulses.  Exam reveals no gallop and no friction rub.   No murmur heard. Pulmonary/Chest: Effort normal and breath sounds normal. No tachypnea. No respiratory distress. She has no decreased breath sounds. She has no wheezes. She has no rhonchi. She has no rales.  Abdominal: Soft. Normal appearance and bowel sounds are normal. There is no tenderness.  Neurological: She is alert.  Skin: Skin is warm, dry and intact. No rash noted.  Psychiatric: Her speech is normal and behavior is normal. Judgment and thought content normal. Her mood appears not anxious. Cognition and memory are normal. She does not exhibit a depressed mood.   No rash on scalp, hair does not come out with pressure, diffuse thinning of hair.       Assessment & Plan:

## 2016-07-12 NOTE — Assessment & Plan Note (Signed)
Well controlled.. Plan to wean off zoloft.

## 2016-07-12 NOTE — Patient Instructions (Addendum)
Decrease zoloft to 25 mg daily x 2-3 weeks , then decrease to every other dya for 1-2 weeks then stop.  If anxiety returning.. Increase back up.  Increase topamax to 150 mg daily. Follow migraine. If hair loss worsened or migraines not  controlled, call to  consider referral to headache wellness center.

## 2016-07-12 NOTE — Assessment & Plan Note (Signed)
Likely due to  Multiple med SE. Will wean off zoloft.

## 2016-07-12 NOTE — Progress Notes (Signed)
Pre visit review using our clinic review tool, if applicable. No additional management support is needed unless otherwise documented below in the visit note. 

## 2016-07-12 NOTE — Assessment & Plan Note (Signed)
Increase topamax to 150 mg daily. Follow migraine. If hair loss worsened or migraines not  controlled, call. Consider referral to headache wellness center.

## 2016-11-14 ENCOUNTER — Ambulatory Visit (INDEPENDENT_AMBULATORY_CARE_PROVIDER_SITE_OTHER): Payer: 59 | Admitting: Family Medicine

## 2016-11-14 ENCOUNTER — Telehealth: Payer: Self-pay | Admitting: Family Medicine

## 2016-11-14 ENCOUNTER — Encounter: Payer: Self-pay | Admitting: Family Medicine

## 2016-11-14 VITALS — BP 120/68 | HR 70 | Temp 98.3°F | Wt 165.5 lb

## 2016-11-14 DIAGNOSIS — Z23 Encounter for immunization: Secondary | ICD-10-CM

## 2016-11-14 DIAGNOSIS — W540XXA Bitten by dog, initial encounter: Secondary | ICD-10-CM | POA: Diagnosis not present

## 2016-11-14 DIAGNOSIS — S31825A Open bite of left buttock, initial encounter: Secondary | ICD-10-CM

## 2016-11-14 NOTE — Progress Notes (Signed)
Dog bite.  Was going for a run in her neighborhood, slowed to a walk when near a dog that was on a walk with another person, on a leash.  Bitten on L buttock area.  Patient called the vet and the dog is up to date on vaccines.  The dog is currently quarantined currently by animal control.  No other known hx of bites from that dog.    Bite broke the skin through clothes.  Prev was bleeding, she cleaned the area.    Meds, vitals, and allergies reviewed.   ROS: Per HPI unless specifically indicated in ROS section   nad Chaperoned exam.  Small abrasion on L lower back/gluteal area, no bruising noted.  No fluctuant mass, no bleeding.

## 2016-11-14 NOTE — Telephone Encounter (Signed)
Pt has appt to see Dr Para Marchuncan today at 3 pm; will need tetanus shot and dog was a neighborhood dog.

## 2016-11-14 NOTE — Telephone Encounter (Signed)
[facility] - Client TELEPHONE ADVICE RECORD [facility]  Patient Name: Berdine AddisonLEIGHANN Packard  DOB: 08/03/1995    Initial Comment daughter bit by dog, dog is up to date on shots, you will be calling the daughter back   Nurse Assessment  Nurse: Odis LusterBowers, RN, Bjorn Loserhonda Date/Time (Eastern Time): 11/14/2016 11:36:24 AM  Confirm and document reason for call. If symptomatic, describe symptoms. ---bit by dog, dog is up to date on shots, bit on her side near her buttocks. The skin was broken and bleeding. Bleeding has stopped.  Does the patient have any new or worsening symptoms? ---Yes  Will a triage be completed? ---Yes  Related visit to physician within the last 2 weeks? ---No  Does the PT have any chronic conditions? (i.e. diabetes, asthma, etc.) ---Yes  List chronic conditions. ---migraines  Is the patient pregnant or possibly pregnant? (Ask all females between the ages of 2412-55) ---No  Is this a behavioral health or substance abuse call? ---No     Guidelines    Guideline Title Affirmed Question Affirmed Notes  Animal Bite Small puncture wound (e.g., from gerbil, mouse, hamster, puppy)    Final Disposition User   Home Care Odis LusterBowers, RN, Bjorn Loserhonda    Disagree/Comply: Comply

## 2016-11-14 NOTE — Patient Instructions (Signed)
Keep the area clean and covered.   Since the mark is very shallow, then you shouldn't need antibiotics by mouth.  It is more of a scrape and not a puncture.   If you have any spreading redness then let us know.   Tetanus shot today.  Take care.  Glad to see you.  Update us as needed.

## 2016-11-15 DIAGNOSIS — W540XXA Bitten by dog, initial encounter: Secondary | ICD-10-CM | POA: Insufficient documentation

## 2016-11-15 NOTE — Assessment & Plan Note (Signed)
Advised to keep the area clean and covered.   Since the mark is very shallow, she shouldn't need antibiotics by mouth.  It is more of a scrape/abrasion and not a puncture.   Superficial.  If any spreading redness then let us know.   Tetanus shot today.  Patient agrees with plan.  Animal in custody of animal control.

## 2017-01-09 LAB — HM PAP SMEAR: HM Pap smear: NEGATIVE

## 2017-01-21 ENCOUNTER — Other Ambulatory Visit: Payer: Self-pay | Admitting: Family Medicine

## 2017-05-18 ENCOUNTER — Other Ambulatory Visit: Payer: Self-pay | Admitting: Family Medicine

## 2017-10-21 ENCOUNTER — Telehealth: Payer: Self-pay | Admitting: Family Medicine

## 2017-10-21 DIAGNOSIS — Z01419 Encounter for gynecological examination (general) (routine) without abnormal findings: Secondary | ICD-10-CM

## 2017-10-21 NOTE — Telephone Encounter (Signed)
Copied from CRM [phone]. Topic: Referral - Request >> Oct 21, 2017 10:25 AM Julie Mckenzie, Julie Mckenzie wrote: Reason for CRM: pt called in and stated that she has an appt with her GYN tomorrow but per her ins she need Mckenzie referral from PCP  [facility]  [address]  [phone]

## 2017-11-10 ENCOUNTER — Telehealth: Payer: Self-pay

## 2017-11-10 DIAGNOSIS — J039 Acute tonsillitis, unspecified: Secondary | ICD-10-CM

## 2017-11-10 DIAGNOSIS — J358 Other chronic diseases of tonsils and adenoids: Secondary | ICD-10-CM

## 2017-11-10 NOTE — Telephone Encounter (Signed)
Pt last seen by Dr Ermalene SearingBedsole on 07/12/16.Please advise.

## 2017-11-10 NOTE — Telephone Encounter (Signed)
Copied from CRM [id number]. Topic: Referral - Request >> Nov 10, 2017  1:19 PM Tamela OddiMartin, Don'Quashia, NT wrote: Reason for CRM: Patient called and states that she would like to be referred to an ENT . Patient states she is having tonsils issues ( tonsils stones). Please all patient when and if that referral is placed. CB# [phone]

## 2017-11-11 NOTE — Telephone Encounter (Signed)
Referral sent. Let pt know. 

## 2017-11-11 NOTE — Telephone Encounter (Signed)
Left message for Julie GoadLeighann that Dr. Ermalene SearingBedsole has placed in referral she requested and Shirlee LimerickMarion or Alvina Chounastasiya will be calling her with that appointment once they have everything arranged for her.

## 2017-12-10 ENCOUNTER — Telehealth: Payer: Self-pay

## 2017-12-10 NOTE — Telephone Encounter (Signed)
Copied from CRM [id number]. Topic: General - Other >> Dec 10, 2017  1:26 PM Mcneil, Ja-Kwan wrote: Reason for CRM: Pt states she will be going on a cruise and she needs to speak with someone regarding getting either a patch or an injection for Sea sickness. Pt requests call back. Cb# [phone]

## 2017-12-10 NOTE — Telephone Encounter (Signed)
Copied from CRM [id number]. Topic: General - Other >> Dec 10, 2017  1:41 PM Mcneil, Ja-Kwan wrote: Reason for CRM: Pt returned call and stated she will be on the cruise for 7 days and the pharmacy that she uses [id number] [place], [place] - [address] AT [facility] [address] [phone] (Phone)  [phone] (Fax)

## 2017-12-10 NOTE — Telephone Encounter (Signed)
Unable to reach ptby phone to see how many days pt will be on cruise and when will leave for cruise. Also what pharmacy pt uses. Pt last acute visit 11/14/16 and last FU for migraines on 07/12/16. No future appts scheduled.

## 2017-12-11 MED ORDER — SCOPOLAMINE 1 MG/3DAYS TD PT72: 1.0000 | MEDICATED_PATCH | TRANSDERMAL | 0 refills | Status: DC

## 2017-12-11 NOTE — Telephone Encounter (Signed)
Let pt know patch sent in.

## 2017-12-11 NOTE — Telephone Encounter (Signed)
Pt calling to check status. She leaves for her cruise early Saturday morning. Please notify when RX is sent in.

## 2017-12-11 NOTE — Telephone Encounter (Signed)
Alyha notified by telephone that Dr. Ermalene Searing has sent in a prescription for the patch to her pharmacy.

## 2018-05-06 ENCOUNTER — Ambulatory Visit (INDEPENDENT_AMBULATORY_CARE_PROVIDER_SITE_OTHER): Payer: 59 | Admitting: Family Medicine

## 2018-05-06 ENCOUNTER — Encounter: Payer: Self-pay | Admitting: Family Medicine

## 2018-05-06 VITALS — BP 128/76 | HR 73 | Temp 98.2°F | Ht 65.75 in | Wt 156.8 lb

## 2018-05-06 DIAGNOSIS — M25561 Pain in right knee: Secondary | ICD-10-CM | POA: Diagnosis not present

## 2018-05-06 DIAGNOSIS — Z23 Encounter for immunization: Secondary | ICD-10-CM | POA: Diagnosis not present

## 2018-05-06 NOTE — Assessment & Plan Note (Signed)
Anticipate PFPS - treat with NSAID, ice/heat, rest, exercises [facility] from [facility]. Update if not improving with treatment. No signs of infection.

## 2018-05-06 NOTE — Progress Notes (Signed)
BP 128/76 (BP Location: Left Arm, Patient Position: Sitting, Cuff Size: Normal)   Pulse 73   Temp 98.2 F (36.8 C) (Oral)   Ht 5' 5.75" (1.67 m)   Wt 156 lb 12 oz (71.1 kg)   LMP 03/30/2018 Comment: Irregular periods  SpO2 100%   BMI 25.49 kg/m    CC: R knee swelling Subjective:    Patient ID: Julie Mckenzie, female    DOB: 04/11/1995, 23 y.o.   MRN: [id number]  HPI: Julie Mckenzie is a 23 y.o. female presenting on 05/06/2018 for Joint Swelling (C/o right knee swelling.  Started on 05/04/18. Pt woke up wigth swelling and pain. Denies injury. Tried ibuprofen 200 mg last night. )   2d h/o R knee swelling/pain, pain at superior lateral knee. Last night noted some bruising at tender area, not today. Worse pain with knee flexion. Feels pressure at kneecap. Swelling actually seems to be improving. Worse pain and leg numbness with knee squats.  Denies inciting trauma/injury. Tried ibuprofen 200mg  last night.  No new exercise routine.   She works out 5-6 d/wk at gym. Walks 5-6 miles a day at school.   No back pain, fevers/chills, ankle or foot or hip pain.      Relevant past medical, surgical, family and social history reviewed and updated as indicated. Interim medical history since our last visit reviewed. Allergies and medications reviewed and updated. Outpatient Medications Prior to Visit  Medication Sig Dispense Refill  . ibuprofen (ADVIL,MOTRIN) 200 MG tablet Take 600 mg by mouth every 6 (six) hours as needed for pain.    Marland Kitchen. levonorgestrel (MIRENA) 20 MCG/24HR IUD 1 each by Intrauterine route once.    . SUMAtriptan (IMITREX) 100 MG tablet Take 1 tablet (100 mg total) by mouth every 2 (two) hours as needed for migraine. May repeat in 2 hours if headache persists or recurs. 10 tablet 0  . topiramate (TOPAMAX) 50 MG tablet TAKE 3 TABLETS BY MOUTH DAILY 90 tablet 2  . scopolamine (TRANSDERM-SCOP, 1.5 MG,) 1 MG/3DAYS Place 1 patch (1.5 mg total) onto the skin every 3 (three) days.  4 patch 0   No facility-administered medications prior to visit.      Per HPI unless specifically indicated in ROS section below Review of Systems Objective:    BP 128/76 (BP Location: Left Arm, Patient Position: Sitting, Cuff Size: Normal)   Pulse 73   Temp 98.2 F (36.8 C) (Oral)   Ht 5' 5.75" (1.67 m)   Wt 156 lb 12 oz (71.1 kg)   LMP 03/30/2018 Comment: Irregular periods  SpO2 100%   BMI 25.49 kg/m   Wt Readings from Last 3 Encounters:  05/06/18 156 lb 12 oz (71.1 kg)  11/14/16 165 lb 8 oz (75.1 kg)  07/12/16 167 lb 4 oz (75.9 kg)    Physical Exam Nursing note reviewed.  Constitutional:      Appearance: Normal appearance. She is not ill-appearing.  Musculoskeletal: Normal range of motion.        General: Swelling and tenderness present.     Comments: L knee WNL R knee exam: No deformity on inspection. Tender with palpation of lateral kneecap as well as knee superio-lateral to kneecap No effusion/swelling noted. FROM in flex/extension without crepitus, tender with flexion. No popliteal fullness. Neg drawer test. Neg mcmurray test. No pain with valgus/varus stress. + PFgrind. No abnormal patellar mobility.  No pain to palpation over lateral femoral epicondyle on right  Skin:    General: Skin  is warm and dry.     Findings: No erythema or rash.  Neurological:     Mental Status: She is alert.       Results for orders placed or performed in visit on 07/12/16  POCT urine pregnancy  Result Value Ref Range   Preg Test, Ur Negative Negative   Assessment & Plan:   Problem List Items Addressed This Visit    Acute pain of right knee - Primary    Anticipate PFPS - treat with NSAID, ice/heat, rest, exercises provided from [facility] pt advisor. Update if not improving with treatment. No signs of infection.        Other Visit Diagnoses    Need for influenza vaccination       Relevant Orders   Flu Vaccine QUAD 36+ mos IM (Completed)       No orders of the defined  types were placed in this encounter.  Orders Placed This Encounter  Procedures  . Flu Vaccine QUAD 36+ mos IM    Patient Instructions  I think you have patellofemoral pain syndrome or runner's knee. Do exercises provided today. Treat with ice or heating pad to knee (covered in a towel, 15 min at a time).  Take ibuprofen 400-600mg  three times daily with meals for next few days.  Let us know if not improving with this.    Follow up plan: No follow-ups on file.  Eustaquio Boyden, MD

## 2018-05-06 NOTE — Patient Instructions (Signed)
I think you have patellofemoral pain syndrome or runner's knee. Do exercises provided today. Treat with ice or heating pad to knee (covered in a towel, 15 min at a time).  Take ibuprofen 400-600mg  three times daily with meals for next few days.  Let us know if not improving with this.

## 2018-05-27 ENCOUNTER — Other Ambulatory Visit: Payer: Self-pay | Admitting: Family Medicine

## 2018-05-29 ENCOUNTER — Other Ambulatory Visit: Payer: Self-pay | Admitting: *Deleted

## 2018-05-29 MED ORDER — SUMATRIPTAN SUCCINATE 100 MG PO TABS: 100.0000 mg | ORAL_TABLET | ORAL | 0 refills | Status: DC | PRN

## 2018-05-29 NOTE — Telephone Encounter (Signed)
Last office visit 05/06/2018 with Dr. Reece Agar for knee pain.  Last seen by PCP 07/12/2016.  Last refilled 05/07/2016 for #10 with no refills.  CPE scheduled for 06/12/2018 with Dr. Ermalene Searing.  Ok to refill?

## 2018-06-12 ENCOUNTER — Ambulatory Visit (INDEPENDENT_AMBULATORY_CARE_PROVIDER_SITE_OTHER): Payer: 59 | Admitting: Family Medicine

## 2018-06-12 ENCOUNTER — Encounter: Payer: Self-pay | Admitting: Family Medicine

## 2018-06-12 VITALS — BP 110/74 | HR 75 | Temp 98.8°F | Ht 64.5 in | Wt 154.2 lb

## 2018-06-12 DIAGNOSIS — G43009 Migraine without aura, not intractable, without status migrainosus: Secondary | ICD-10-CM

## 2018-06-12 DIAGNOSIS — F411 Generalized anxiety disorder: Secondary | ICD-10-CM

## 2018-06-12 DIAGNOSIS — Z0000 Encounter for general adult medical examination without abnormal findings: Secondary | ICD-10-CM | POA: Diagnosis not present

## 2018-06-12 MED ORDER — SUMATRIPTAN SUCCINATE 100 MG PO TABS: 100.0000 mg | ORAL_TABLET | ORAL | 1 refills | Status: DC | PRN

## 2018-06-12 MED ORDER — TOPIRAMATE 50 MG PO TABS: 150.0000 mg | ORAL_TABLET | Freq: Every day | ORAL | 11 refills | Status: DC

## 2018-06-12 NOTE — Progress Notes (Signed)
Subjective:    Patient ID: Julie Mckenzie, female    DOB: 07-16-1995, 23 y.o.   MRN: [id number]  HPI The patient is here for annual wellness exam and preventative care.     Migraines: HAs 1-2 migraines per month.. on 150 mg topamax ans imitrex for acute migraine   GAD: No longer on sertraline. Well controlled   GAD7 0 PHQ2 0   Wt Readings from Last 3 Encounters:  06/12/18 154 lb 4 oz (70 kg)  05/06/18 156 lb 12 oz (71.1 kg)  11/14/16 165 lb 8 oz (75.1 kg)  Body mass index is 26.07 kg/m.    Exercise: 5 days a week at [employer] 45 min  Diet: healthy diet.   Social History /Family History/Past Medical History reviewed in detail and updated in EMR if needed. Blood pressure 110/74, pulse 75, temperature 98.8 F (37.1 C), temperature source Oral, height 5' 4.5" (1.638 m), weight 154 lb 4 oz (70 kg), SpO2 99 %.  Review of Systems  Constitutional: Negative for fatigue and fever.  HENT: Negative for congestion.   Eyes: Negative for pain.  Respiratory: Negative for cough and shortness of breath.   Cardiovascular: Negative for chest pain, palpitations and leg swelling.  Gastrointestinal: Negative for abdominal pain.  Genitourinary: Negative for dysuria and vaginal bleeding.  Musculoskeletal: Negative for back pain.  Neurological: Negative for syncope, light-headedness and headaches.  Psychiatric/Behavioral: Negative for dysphoric mood.       Objective:   Physical Exam Constitutional:      General: She is not in acute distress.    Appearance: Normal appearance. She is well-developed. She is not ill-appearing or toxic-appearing.  HENT:     Head: Normocephalic.     Right Ear: Hearing, tympanic membrane, ear canal and external ear normal. Tympanic membrane is not erythematous, retracted or bulging.     Left Ear: Hearing, tympanic membrane, ear canal and external ear normal. Tympanic membrane is not erythematous, retracted or bulging.     Nose: Nose normal. No mucosal  edema or rhinorrhea.     Right Sinus: No maxillary sinus tenderness or frontal sinus tenderness.     Left Sinus: No maxillary sinus tenderness or frontal sinus tenderness.     Mouth/Throat:     Pharynx: Uvula midline.  Eyes:     General: Lids are normal. Lids are everted, no foreign bodies appreciated.     Conjunctiva/sclera: Conjunctivae normal.     Pupils: Pupils are equal, round, and reactive to light.  Neck:     Musculoskeletal: Normal range of motion and neck supple.     Thyroid: No thyroid mass or thyromegaly.     Vascular: No carotid bruit.     Trachea: Trachea normal.  Cardiovascular:     Rate and Rhythm: Normal rate and regular rhythm.     Pulses: Normal pulses.     Heart sounds: Normal heart sounds, S1 normal and S2 normal. No murmur. No friction rub. No gallop.   Pulmonary:     Effort: Pulmonary effort is normal. No tachypnea or respiratory distress.     Breath sounds: Normal breath sounds. No decreased breath sounds, wheezing, rhonchi or rales.  Abdominal:     General: Bowel sounds are normal. There is no distension or abdominal bruit.     Palpations: Abdomen is soft. There is no fluid wave or mass.     Tenderness: There is no abdominal tenderness. There is no guarding or rebound.     Hernia: No  hernia is present.  Lymphadenopathy:     Cervical: No cervical adenopathy.  Skin:    General: Skin is warm and dry.     Findings: No rash.  Neurological:     Mental Status: She is alert.     Cranial Nerves: No cranial nerve deficit.     Sensory: No sensory deficit.  Psychiatric:        Mood and Affect: Mood is not anxious or depressed.        Speech: Speech normal.        Behavior: Behavior normal. Behavior is cooperative.        Thought Content: Thought content normal.        Judgment: Judgment normal.           Assessment & Plan:  The patient's preventative maintenance and recommended screening tests for an annual wellness exam were reviewed in full  today. Brought up to date unless services declined.  Counselled on the importance of diet, exercise, and its role in overall health and mortality. The patient's FH and SH was reviewed, including their home life, tobacco status, and drug and alcohol status.   Vaccines: uptodate with tdap and flu Pap/DVE:  [facility]..  Will get resutls Mammo: not indicated Smoking Status: none ETOH/ drug use:1 glass wine weekly/ none  HIV screen:   has had in past, denies need for STD testing.

## 2018-06-12 NOTE — Patient Instructions (Addendum)
Keep up great work on healthy eating habits and regular exercise!

## 2018-06-12 NOTE — Assessment & Plan Note (Signed)
Topamax 150 mg daily working well for prevention. Imitrex effective for acute treatment.

## 2018-06-12 NOTE — Assessment & Plan Note (Signed)
Excellent control on no medicaiton.

## 2018-06-17 ENCOUNTER — Encounter: Payer: Self-pay | Admitting: Family Medicine

## 2018-08-28 ENCOUNTER — Other Ambulatory Visit: Payer: Self-pay | Admitting: Family Medicine

## 2018-10-14 ENCOUNTER — Other Ambulatory Visit: Payer: Self-pay | Admitting: Otolaryngology

## 2018-11-17 ENCOUNTER — Encounter: Payer: Self-pay | Admitting: *Deleted

## 2018-11-17 ENCOUNTER — Other Ambulatory Visit: Payer: Self-pay

## 2018-11-20 ENCOUNTER — Other Ambulatory Visit: Payer: Self-pay | Admitting: Otolaryngology

## 2018-11-20 ENCOUNTER — Other Ambulatory Visit
Admission: RE | Admit: 2018-11-20 | Discharge: 2018-11-20 | Disposition: A | Discharge status: Home / Self Care | Payer: 59 | Source: Ambulatory Visit | Attending: Otolaryngology | Admitting: Otolaryngology

## 2018-11-20 ENCOUNTER — Other Ambulatory Visit: Payer: Self-pay

## 2018-11-20 DIAGNOSIS — J351 Hypertrophy of tonsils: Secondary | ICD-10-CM

## 2018-11-20 DIAGNOSIS — Z01812 Encounter for preprocedural laboratory examination: Secondary | ICD-10-CM | POA: Insufficient documentation

## 2018-11-20 DIAGNOSIS — Z20828 Contact with and (suspected) exposure to other viral communicable diseases: Secondary | ICD-10-CM | POA: Insufficient documentation

## 2018-11-21 LAB — SARS CORONAVIRUS 2 (TAT 6-24 HRS): SARS Coronavirus 2: NEGATIVE

## 2018-11-25 ENCOUNTER — Ambulatory Visit
Admission: RE | Admit: 2018-11-25 | Discharge: 2018-11-25 | Disposition: A | Discharge status: Home / Self Care | Payer: 59 | Attending: Otolaryngology | Admitting: Otolaryngology

## 2018-11-25 ENCOUNTER — Ambulatory Visit: Payer: 59 | Admitting: Anesthesiology

## 2018-11-25 ENCOUNTER — Encounter
Admission: RE | Disposition: A | Discharge status: Home / Self Care | Payer: Self-pay | Source: Home / Self Care | Attending: Otolaryngology

## 2018-11-25 DIAGNOSIS — R51 Headache: Secondary | ICD-10-CM | POA: Diagnosis not present

## 2018-11-25 DIAGNOSIS — J358 Other chronic diseases of tonsils and adenoids: Secondary | ICD-10-CM | POA: Diagnosis not present

## 2018-11-25 DIAGNOSIS — J3501 Chronic tonsillitis: Secondary | ICD-10-CM | POA: Diagnosis present

## 2018-11-25 DIAGNOSIS — R599 Enlarged lymph nodes, unspecified: Secondary | ICD-10-CM | POA: Insufficient documentation

## 2018-11-25 HISTORY — DX: Motion sickness, initial encounter: T75.3XXA

## 2018-11-25 HISTORY — PX: TONSILLECTOMY: SHX5217

## 2018-11-25 HISTORY — PX: ADENOIDECTOMY: SHX5191

## 2018-11-25 LAB — POCT PREGNANCY, URINE: Preg Test, Ur: NEGATIVE

## 2018-11-25 SURGERY — TONSILLECTOMY
Anesthesia: General | Site: Mouth | Laterality: Bilateral

## 2018-11-25 MED ORDER — OXYCODONE HCL 5 MG/5ML PO SOLN: 5.0000 mg | ORAL | 0 refills | Status: AC | PRN

## 2018-11-25 MED ORDER — BUPIVACAINE HCL (PF) 0.25 % IJ SOLN
INTRAMUSCULAR | Status: DC | PRN
  Administered 2018-11-25: 1 mL

## 2018-11-25 MED ORDER — ACETAMINOPHEN 10 MG/ML IV SOLN
1000.0000 mg | Freq: Once | INTRAVENOUS | Status: AC
  Administered 2018-11-25: 09:00:00 1000 mg via INTRAVENOUS

## 2018-11-25 MED ORDER — FENTANYL CITRATE (PF) 100 MCG/2ML IJ SOLN
INTRAMUSCULAR | Status: DC | PRN
  Administered 2018-11-25: 25 ug via INTRAVENOUS
  Administered 2018-11-25: 100 ug via INTRAVENOUS
  Administered 2018-11-25 (×2): 25 ug via INTRAVENOUS

## 2018-11-25 MED ORDER — ONDANSETRON HCL 4 MG PO TABS: 4.0000 mg | ORAL_TABLET | Freq: Three times a day (TID) | ORAL | 0 refills | Status: DC | PRN

## 2018-11-25 MED ORDER — OXYCODONE HCL 5 MG/5ML PO SOLN
5.0000 mg | Freq: Once | ORAL | Status: AC | PRN
  Administered 2018-11-25: 11:00:00 5 mg via ORAL

## 2018-11-25 MED ORDER — LIDOCAINE HCL (CARDIAC) PF 100 MG/5ML IV SOSY
PREFILLED_SYRINGE | INTRAVENOUS | Status: DC | PRN
  Administered 2018-11-25: 40 mg via INTRAVENOUS

## 2018-11-25 MED ORDER — LACTATED RINGERS IV SOLN
INTRAVENOUS | Status: DC
  Administered 2018-11-25: 09:00:00 via INTRAVENOUS

## 2018-11-25 MED ORDER — DEXMEDETOMIDINE HCL 200 MCG/2ML IV SOLN
INTRAVENOUS | Status: DC | PRN
  Administered 2018-11-25: 15 ug via INTRAVENOUS

## 2018-11-25 MED ORDER — PROPOFOL 10 MG/ML IV BOLUS
INTRAVENOUS | Status: DC | PRN
  Administered 2018-11-25: 150 mg via INTRAVENOUS

## 2018-11-25 MED ORDER — GLYCOPYRROLATE 0.2 MG/ML IJ SOLN
INTRAMUSCULAR | Status: DC | PRN
  Administered 2018-11-25: 0.1 mg via INTRAVENOUS

## 2018-11-25 MED ORDER — LIDOCAINE VISCOUS HCL 2 % MT SOLN: 10.0000 mL | Freq: Four times a day (QID) | OROMUCOSAL | 0 refills | Status: DC | PRN

## 2018-11-25 MED ORDER — OXYCODONE HCL 5 MG PO TABS: 5.0000 mg | ORAL_TABLET | Freq: Once | ORAL | Status: AC | PRN

## 2018-11-25 MED ORDER — SCOPOLAMINE 1 MG/3DAYS TD PT72
1.0000 | MEDICATED_PATCH | Freq: Once | TRANSDERMAL | Status: DC
  Administered 2018-11-25: 09:00:00 1.5 mg via TRANSDERMAL

## 2018-11-25 MED ORDER — FENTANYL CITRATE (PF) 100 MCG/2ML IJ SOLN: 25.0000 ug | INTRAMUSCULAR | Status: DC | PRN

## 2018-11-25 MED ORDER — SUCCINYLCHOLINE CHLORIDE 20 MG/ML IJ SOLN
INTRAMUSCULAR | Status: DC | PRN
  Administered 2018-11-25: 80 mg via INTRAVENOUS

## 2018-11-25 MED ORDER — OXYMETAZOLINE HCL 0.05 % NA SOLN
NASAL | Status: DC | PRN
  Administered 2018-11-25: 1 via TOPICAL

## 2018-11-25 MED ORDER — ONDANSETRON HCL 4 MG/2ML IJ SOLN: 4.0000 mg | Freq: Once | INTRAMUSCULAR | Status: DC | PRN

## 2018-11-25 MED ORDER — ONDANSETRON HCL 4 MG/2ML IJ SOLN
INTRAMUSCULAR | Status: DC | PRN
  Administered 2018-11-25: 4 mg via INTRAVENOUS

## 2018-11-25 MED ORDER — MIDAZOLAM HCL 5 MG/5ML IJ SOLN
INTRAMUSCULAR | Status: DC | PRN
  Administered 2018-11-25: 2 mg via INTRAVENOUS

## 2018-11-25 MED ORDER — DEXAMETHASONE SODIUM PHOSPHATE 4 MG/ML IJ SOLN
INTRAMUSCULAR | Status: DC | PRN
  Administered 2018-11-25: 10 mg via INTRAVENOUS

## 2018-11-25 SURGICAL SUPPLY — 19 items
BLADE BOVIE TIP EXT 4 (BLADE) ×3 IMPLANT
CANISTER SUCT 1200ML W/VALVE (MISCELLANEOUS) ×3 IMPLANT
CATH ROBINSON RED A/P 10FR (CATHETERS) ×3 IMPLANT
COAG SUCT 10F 3.5MM HAND CTRL (MISCELLANEOUS) ×3 IMPLANT
ELECT REM PT RETURN 9FT ADLT (ELECTROSURGICAL) ×3
ELECTRODE REM PT RTRN 9FT ADLT (ELECTROSURGICAL) ×1 IMPLANT
GLOVE BIO SURGEON STRL SZ7.5 (GLOVE) ×3 IMPLANT
HANDLE SUCTION POOLE (INSTRUMENTS) ×1 IMPLANT
KIT TURNOVER KIT A (KITS) ×3 IMPLANT
NEEDLE HYPO 25GX1X1/2 BEV (NEEDLE) ×3 IMPLANT
NS IRRIG 500ML POUR BTL (IV SOLUTION) ×3 IMPLANT
PACK TONSIL AND ADENOID CUSTOM (PACKS) ×3 IMPLANT
PENCIL SMOKE EVACUATOR (MISCELLANEOUS) ×3 IMPLANT
SLEEVE SUCTION 125 (MISCELLANEOUS) ×3 IMPLANT
SOL ANTI-FOG 6CC FOG-OUT (MISCELLANEOUS) ×1 IMPLANT
SOL FOG-OUT ANTI-FOG 6CC (MISCELLANEOUS) ×2
STRAP BODY AND KNEE 60X3 (MISCELLANEOUS) ×3 IMPLANT
SUCTION POOLE HANDLE (INSTRUMENTS) ×3
SYR 5ML LL (SYRINGE) ×3 IMPLANT

## 2018-11-25 NOTE — Op Note (Signed)
..  11/25/2018  10:28 AM    [name]  [phone]   Pre-Op Dx:  Tonsillolithiasis, Chronic tonsillitis  Post-op Dx: same  Proc:Tonsillectomy > age 24  Surg: Halima Fogal  Anes:  General Endotracheal  EBL:  <33ml  Comp:  None  Findings:  3+ cryptic tonsils with tonsillolithiasis  Procedure: After the patient was identified in holding and the history and physical and consent was reviewed, the patient was taken to the operating room and placed in a supine position.  General endotracheal anesthesia was induced in the normal fashion.  At this time, the patient was rotated 45 degrees and a shoulder roll was placed.  At this time, a McIvor mouthgag was inserted into the patient's oral cavity and suspended from the [place] stand without injury to teeth, lips, or gums.  Next a red rubber catheter was inserted into the patient left nostril for retraction of the uvula and soft palate superiorly.  Next a curved Alice clamp was attached to the patient's right superior tonsillar pole and retracted medially and inferiorly.  A Bovie electrocautery was used to dissect the patient's right tonsil in a subcapsular plane.  Meticulous hemostasis was achieved with Bovie suction cautery.  At this time, the mouth gag was released from suspension for 1 minute.  Attention now was directed to the patient's left side.  In a similar fashion the curved Alice clamp was attached to the superior pole and this was retracted medially and inferiorly and the tonsil was excised in a subcapsular plane with Bovie electrocautery.  After completion of the second tonsil, meticulous hemostasis was continued.  At this time, attention was directed to the patient's Adenoidectomy.  Under indirect visualization using an operating mirror, the adenoid tissue was visualized and noted to be absent.    At this time, the patient's nasal cavity and oral cavity was irrigated with sterile saline.  One ml of 0.25% Marcaine was injected  into the anterior and posterior tonsillar fossa bilaterally.  Following this  The care of patient was returned to anesthesia, awakened, and transferred to recovery in stable condition.  Dispo:  PACU to home  Plan: Soft diet.  Limit exercise and strenuous activity for 2 weeks.  Fluid hydration  Recheck my office three weeks.   [name] 10:28 AM 11/25/2018

## 2018-11-25 NOTE — Discharge Instructions (Signed)
T & A INSTRUCTION SHEET - [facility]  Carloyn Manner, MD  [address] [place], [place] [address] TEL.  [phone]  INFORMATION SHEET FOR A TONSILLECTOMY AND ADENDOIDECTOMY  About Your Tonsils and Adenoids  The tonsils and adenoids are normal body tissues that are part of our immune system.  They normally help to protect [place] against diseases that may enter our mouth and nose. However, sometimes the tonsils and/or adenoids become too large and obstruct our breathing, especially at night.    If either of these things happen it helps to remove the tonsils and adenoids in order to become healthier. The operation to remove the tonsils and adenoids is called a tonsillectomy and adenoidectomy.  The Location of Your Tonsils and Adenoids  The tonsils are located in the back of the throat on both side and sit in a cradle of muscles. The adenoids are located in the roof of the mouth, behind the nose, and closely associated with the opening of the Eustachian tube to the ear.  Surgery on Tonsils and Adenoids  A tonsillectomy and adenoidectomy is a short operation which takes about thirty minutes.  This includes being put to sleep and being awakened. Tonsillectomies and adenoidectomies are performed at [facility] and may require observation period in the recovery room prior to going home. Children are required to remain in recovery for at least 45 minutes.   Following the Operation for a Tonsillectomy  A cautery machine is used to control bleeding. Bleeding from a tonsillectomy and adenoidectomy is minimal and postoperatively the risk of bleeding is approximately four percent, although this rarely life threatening.  After your tonsillectomy and adenoidectomy post-op care at home: 1. Our patients are able to go home the same day. You may be given prescriptions for pain medications, if indicated. 2. It is extremely important to  remember that fluid intake is of utmost importance after a tonsillectomy. The amount that you drink must be maintained in the postoperative period. A good indication of whether a child is getting enough fluid is whether his/her urine output is constant. As long as children are urinating or wetting their diaper every 6 - 8 hours this is usually enough fluid intake.   3. Although rare, this is a risk of some bleeding in the first ten days after surgery. This usually occurs between day five and nine postoperatively. This risk of bleeding is approximately four percent. If you or your child should have any bleeding you should remain calm and notify our office or go directly to the emergency room at [facility] where they will contact us. Our doctors are available seven days a week for notification. We recommend sitting up quietly in a chair, place an ice pack on the front of the neck and spitting out the blood gently until we are able to contact you. Adults should gargle gently with ice water and this may help stop the bleeding. If the bleeding does not stop after a short time, i.e. 10 to 15 minutes, or seems to be increasing again, please contact us or go to the hospital.   4. It is common for the pain to be worse at 5 - 7 days postoperatively. This occurs because the scab is peeling off and the mucous membrane (skin of the throat) is growing back where the tonsils were.   5. It is common for a low-grade fever, less than 102, during the first week  after a tonsillectomy and adenoidectomy. It is usually due to not drinking enough liquids, and we suggest your use liquid Tylenol (acetaminophen) or the pain medicine with Tylenol (acetaminophen) prescribed in order to keep your temperature below 102. Please follow the directions on the back of the bottle. °6. Recommendations for post-operative pain in children and adults: °a) For Children 12 and younger: Recommendations are for oral Tylenol  (acetaminophen) and oral Motrin (Ibuprofen) along with a prescription dose of Prednisolone which is a steroid to help with pain and swelling. Administer the Tylenol (acetaminophen) and Motrin as stated on bottle for patient's age/weight. Sometimes it may be necessary to alternate the Tylenol (acetaminophen) and Motrin for improved pain control. Motrin does last slightly longer so many patients benefit from being given this prior to bedtime. All children should avoid Aspirin products for 2 weeks following surgery. °b) For children over the age of 12: Tylenol (acetaminophen) is the preferred first choice for pain control. Depending on your child's size, sometimes they will be given a combination of Tylenol (acetaminophen) and hydrocodone medication or sometimes it will be recommended they take Motrin (ibuprofen) in addition to the Tylenol (acetaminophen). Narcotics should always be used with caution in children following surgery as they can suppress their breathing and switching to over the counter Tylenol (acetaminophen) and Motrin (ibuprofen) as soon as possible is recommended. All patients should avoid Aspirin products for 2 weeks following surgery. °c) Adults: Usually adults will require a narcotic pain medication following a tonsillectomy. This usually has either hydrocodone or oxycodone in it and can usually be taken every 4 to 6 hours as needed for moderate pain. If the medication does not have Tylenol (acetaminophen) in it, you may also supplement Tylenol (acetaminophen) as needed every 4 to 6 hours for breakthrough or mild pain. Adults are also given Viscous Lidocaine to swish and spit every 6 hours to help with topical pain. Adults should avoid Aspirin, Aleve, Motrin, and Ibuprofen products for 2 weeks following surgery as they can increase your risk of bleeding. °7. If you happen to look in the mirror or into your child's mouth you will see white/gray patches on the back of the throat. This is what a scab  looks like in the mouth and is normal after having a tonsillectomy and adenoidectomy. They will disappear once the tonsil areas heal completely. However, it may cause a noticeable odor, and this too will disappear with time.     °8. You or your child may experience ear pain after having a tonsillectomy and adenoidectomy.  This is called referred pain and comes from the throat, but it is felt in the ears.  Ear pain is quite common and expected. It will usually go away after ten days. There is usually nothing wrong with the ears, and it is primarily due to the healing area stimulating the nerve to the ear that runs along the side of the throat. Use either the prescribed pain medicine or Tylenol (acetaminophen) as needed.  °9. The throat tissues after a tonsillectomy are obviously sensitive. Smoking around children who have had a tonsillectomy significantly increases the risk of bleeding. DO NOT SMOKE! ° °What to Expect Each Day  °First Day at Home °1. Patients will be discharged home the same day.  °2. Drink at least four glasses of liquid a day. Clear, cool liquids are recommended. Fruit juices containing citric acid are not recommended because they tend to cause pain. Carbonated beverages are allowed if you pour them from glass   to glass to remove the bubbles as these tend to cause discomfort. Avoid alcoholic beverages.  °3. Eat very soft foods such as soups, broth, jello, custard, pudding, ice cream, popsicles, applesauce, mashed potatoes, and in general anything that you can crush between your tongue and the roof of your mouth. Try adding Carnation Instant Breakfast Mix into your food for extra calories. It is not uncommon to lose 5 to 10 pounds of fluid weight. The weight will be gained back quickly once you're feeling better and drinking more.  °4. Sleep with your head elevated on two pillows for about three days to help decrease the swelling.  °5. DO NOT SMOKE!  °Day Two  °1. Rest as much as possible. Use common  sense in your activities.  °2. Continue drinking at least four glasses of liquid per day.  °3. Follow the soft diet.  °4. Use your pain medication as needed.  °Day Three  °1. Advance your activity as you are able and continue to follow the previous day's suggestions.  °Days Four Through Six  °1. Advance your diet and begin to eat more solid foods such as chopped hamburger. °2. Advance your activities slowly. Children should be kept mostly around the house.  °3. Not uncommonly, there will be more pain at this time. It is temporary, usually lasting a day or two.  °Day Seven Through Ten  °1. Most individuals by this time are able to return to work or school unless otherwise instructed. Consider sending children back to school for a half day on the first day back. ° °Scopolamine skin patches °REMOVE PATCH IN 72 HOURS AND WASH HANDS IMMEDIATELY ° °What is this medicine? °SCOPOLAMINE (skoe POL a meen) is used to prevent nausea and vomiting caused by motion sickness, anesthesia and surgery. °This medicine may be used for other purposes; ask your health care provider or pharmacist if you have questions. °COMMON BRAND NAME(S): Transderm Scop °What should I tell my health care provider before I take this medicine? °They need to know if you have any of these conditions: °· are scheduled to have a gastric secretion test °· glaucoma °· heart disease °· kidney disease °· liver disease °· lung or breathing disease, like asthma °· mental illness °· prostate disease °· seizures °· stomach or intestine problems °· trouble passing urine °· an unusual or allergic reaction to scopolamine, atropine, other medicines, foods, dyes, or preservatives °· pregnant or trying to get pregnant °· breast-feeding °How should I use this medicine? °This medicine is for external use only. Follow the directions on the prescription label. Wear only 1 patch at a time. Choose an area behind the ear, that is clean, dry, hairless and free from any cuts or  irritation. Wipe the area with a clean dry tissue. Peel off the plastic backing of the skin patch, trying not to touch the adhesive side with your hands. Do not cut the patches. Firmly apply to the area you have chosen, with the metallic side of the patch to the skin and the tan-colored side showing. Once firmly in place, wash your hands well with soap and water. Do not get this medicine into your eyes. °After removing the patch, wash your hands and the area behind your ear thoroughly with soap and water. The patch will still contain some medicine after use. To avoid accidental contact or ingestion by children or pets, fold the used patch in half with the sticky side together and throw away in the trash out of   the reach of children and pets. If you need to use a second patch after you remove the first, place it behind the other ear. °A special MedGuide will be given to you by the pharmacist with each prescription and refill. Be sure to read this information carefully each time. °Talk to your pediatrician regarding the use of this medicine in children. Special care may be needed. °Overdosage: If you think you have taken too much of this medicine contact a poison control center or emergency room at once. °NOTE: This medicine is only for you. Do not share this medicine with others. °What if I miss a dose? °This does not apply. This medicine is not for regular use. °What may interact with this medicine? °· alcohol °· antihistamines for allergy cough and cold °· atropine °· certain medicines for anxiety or sleep °· certain medicines for bladder problems like oxybutynin, tolterodine °· certain medicines for depression like amitriptyline, fluoxetine, sertraline °· certain medicines for stomach problems like dicyclomine, hyoscyamine °· certain medicines for Parkinson's disease like benztropine, trihexyphenidyl °· certain medicines for seizures like phenobarbital, primidone °· general anesthetics like halothane, isoflurane,  methoxyflurane, propofol °· ipratropium °· local anesthetics like lidocaine, pramoxine, tetracaine °· medicines that relax muscles for surgery °· phenothiazines like chlorpromazine, mesoridazine, prochlorperazine, thioridazine °· narcotic medicines for pain °· other belladonna alkaloids °This list may not describe all possible interactions. Give your health care provider a list of all the medicines, herbs, non-prescription drugs, or dietary supplements you use. Also tell them if you smoke, drink alcohol, or use illegal drugs. Some items may interact with your medicine. °What should I watch for while using this medicine? °Limit contact with water while swimming and bathing because the patch may fall off. If the patch falls off, throw it away and put a new one behind the other ear. °You may get drowsy or dizzy. Do not drive, use machinery, or do anything that needs mental alertness until you know how this medicine affects you. Do not stand or sit up quickly, especially if you are an older patient. This reduces the risk of dizzy or fainting spells. Alcohol may interfere with the effect of this medicine. Avoid alcoholic drinks. °Your mouth may get dry. Chewing sugarless gum or sucking hard candy, and drinking plenty of water may help. Contact your healthcare professional if the problem does not go away or is severe. °This medicine may cause dry eyes and blurred vision. If you wear contact lenses, you may feel some discomfort. Lubricating drops may help. See your healthcare professional if the problem does not go away or is severe. °If you are going to need surgery, an MRI, CT scan, or other procedure, tell your healthcare professional that you are using this medicine. You may need to remove the patch before the procedure. °What side effects may I notice from receiving this medicine? °Side effects that you should report to your doctor or health care professional as soon as possible: °· allergic reactions like skin rash,  itching or hives; swelling of the face, lips, or tongue °· blurred vision °· changes in vision °· confusion °· dizziness °· eye pain °· fast, irregular heartbeat °· hallucinations, loss of contact with reality °· nausea, vomiting °· pain or trouble passing urine °· restlessness °· seizures °· skin irritation °· stomach pain °Side effects that usually do not require medical attention (report to your doctor or health care professional if they continue or are bothersome): °· drowsiness °· dry mouth °· headache °· sore   This list may not describe all possible side effects. Call your doctor for medical advice about side effects. You may report side effects to FDA at 1-800-FDA-1088. °Where should I keep my medicine? °Keep out of the reach of children. °Store at room temperature between 20 and 25 degrees C (68 and 77 degrees F). Keep this medicine in the foil package until ready to use. Throw away any unused medicine after the expiration date. °NOTE: This sheet is a summary. It may not cover all possible information. If you have questions about this medicine, talk to your doctor, pharmacist, or health care provider. °© 2020 Elsevier/Gold Standard (2017-06-13 16:14:46) ° ° °General Anesthesia, Adult, Care After °This sheet gives you information about how to care for yourself after your procedure. Your health care provider may also give you more specific instructions. If you have problems or questions, contact your health care provider. °What can I expect after the procedure? °After the procedure, the following side effects are common: °· Pain or discomfort at the IV site. °· Nausea. °· Vomiting. °· Sore throat. °· Trouble concentrating. °· Feeling cold or chills. °· Weak or tired. °· Sleepiness and fatigue. °· Soreness and body aches. These side effects can affect parts of the body that were not involved in surgery. °Follow these instructions at home: ° °For at least 24 hours after the procedure: °· Have a  responsible adult stay with you. It is important to have someone help care for you until you are awake and alert. °· Rest as needed. °· Do not: °? Participate in activities in which you could fall or become injured. °? Drive. °? Use heavy machinery. °? Drink alcohol. °? Take sleeping pills or medicines that cause drowsiness. °? Make important decisions or sign legal documents. °? Take care of children on your own. °Eating and drinking °· Follow any instructions from your health care provider about eating or drinking restrictions. °· When you feel hungry, start by eating small amounts of foods that are soft and easy to digest (bland), such as toast. Gradually return to your regular diet. °· Drink enough fluid to keep your urine pale yellow. °· If you vomit, rehydrate by drinking water, juice, or clear broth. °General instructions °· If you have sleep apnea, surgery and certain medicines can increase your risk for breathing problems. Follow instructions from your health care provider about wearing your sleep device: °? Anytime you are sleeping, including during daytime naps. °? While taking prescription pain medicines, sleeping medicines, or medicines that make you drowsy. °· Return to your normal activities as told by your health care provider. Ask your health care provider what activities are safe for you. °· Take over-the-counter and prescription medicines only as told by your health care provider. °· If you smoke, do not smoke without supervision. °· Keep all follow-up visits as told by your health care provider. This is important. °Contact a health care provider if: °· You have nausea or vomiting that does not get better with medicine. °· You cannot eat or drink without vomiting. °· You have pain that does not get better with medicine. °· You are unable to pass urine. °· You develop a skin rash. °· You have a fever. °· You have redness around your IV site that gets worse. °Get help right away if: °· You have  difficulty breathing. °· You have chest pain. °· You have blood in your urine or stool, or you vomit blood. °Summary °· After the procedure, it is common   to have a sore throat or nausea. It is also common to feel tired.  Have a responsible adult stay with you for the first 24 hours after general anesthesia. It is important to have someone help care for you until you are awake and alert.  When you feel hungry, start by eating small amounts of foods that are soft and easy to digest (bland), such as toast. Gradually return to your regular diet.  Drink enough fluid to keep your urine pale yellow.  Return to your normal activities as told by your health care provider. Ask your health care provider what activities are safe for you. This information is not intended to replace advice given to you by your health care provider. Make sure you discuss any questions you have with your health care provider. Document Released: 07/01/2000 Document Revised: 03/28/2017 Document Reviewed: 11/08/2016 Elsevier Patient Education  2020 [employer].

## 2018-11-25 NOTE — Anesthesia Postprocedure Evaluation (Signed)
Anesthesia Post Note  Patient: Julie Mckenzie  Procedure(s) Performed: TONSILLECTOMY (Bilateral Mouth) ADENOIDECTOMY (Bilateral Mouth)  Patient location during evaluation: PACU Anesthesia Type: General Level of consciousness: awake and alert and oriented Pain management: satisfactory to patient Vital Signs Assessment: post-procedure vital signs reviewed and stable Respiratory status: spontaneous breathing, nonlabored ventilation and respiratory function stable Cardiovascular status: blood pressure returned to baseline and stable Postop Assessment: Adequate PO intake and No signs of nausea or vomiting Anesthetic complications: no    Raliegh Ip

## 2018-11-25 NOTE — H&P (Signed)
..  History and Physical paper copy reviewed and updated date of procedure and will be scanned into system.  Patient seen and examined.  

## 2018-11-25 NOTE — Transfer of Care (Signed)
Immediate Anesthesia Transfer of Care Note  Patient: Julie Mckenzie  Procedure(s) Performed: TONSILLECTOMY (Bilateral Mouth) ADENOIDECTOMY (Bilateral Mouth)  Patient Location: PACU  Anesthesia Type: General ETT  Level of Consciousness: awake, alert  and patient cooperative  Airway and Oxygen Therapy: Patient Spontanous Breathing and Patient connected to supplemental oxygen  Post-op Assessment: Post-op Vital signs reviewed, Patient's Cardiovascular Status Stable, Respiratory Function Stable, Patent Airway and No signs of Nausea or vomiting  Post-op Vital Signs: Reviewed and stable  Complications: No apparent anesthesia complications

## 2018-11-25 NOTE — Anesthesia Preprocedure Evaluation (Signed)
Anesthesia Evaluation  Patient identified by MRN, date of birth, ID band Patient awake    Reviewed: Allergy & Precautions, H&P , NPO status , Patient's Chart, lab work & pertinent test results  Airway Mallampati: II  TM Distance: >3 FB Neck ROM: full    Dental no notable dental hx.    Pulmonary    Pulmonary exam normal breath sounds clear to auscultation       Cardiovascular Normal cardiovascular exam Rhythm:regular Rate:Normal     Neuro/Psych  Headaches, PSYCHIATRIC DISORDERS    GI/Hepatic   Endo/Other    Renal/GU      Musculoskeletal   Abdominal   Peds  Hematology   Anesthesia Other Findings   Reproductive/Obstetrics                             Anesthesia Physical Anesthesia Plan  ASA: II  Anesthesia Plan: General ETT   Post-op Pain Management:    Induction:   PONV Risk Score and Plan: 3 and Ondansetron, Dexamethasone, Scopolamine patch - Pre-op and Treatment may vary due to age or medical condition  Airway Management Planned:   Additional Equipment:   Intra-op Plan:   Post-operative Plan:   Informed Consent: I have reviewed the patients History and Physical, chart, labs and discussed the procedure including the risks, benefits and alternatives for the proposed anesthesia with the patient or authorized representative who has indicated his/her understanding and acceptance.       Plan Discussed with: CRNA  Anesthesia Plan Comments:         Anesthesia Quick Evaluation

## 2018-11-25 NOTE — Anesthesia Procedure Notes (Signed)
Procedure Name: Intubation Date/Time: 11/25/2018 10:11 AM Performed by: Mayme Genta, CRNA Pre-anesthesia Checklist: Patient identified, Emergency Drugs available, Suction available, Patient being monitored and Timeout performed Patient Re-evaluated:Patient Re-evaluated prior to induction Oxygen Delivery Method: Circle system utilized Preoxygenation: Pre-oxygenation with 100% oxygen Induction Type: IV induction Ventilation: Mask ventilation without difficulty Laryngoscope Size: Miller and 2 Grade View: Grade I Tube type: Oral Rae Tube size: 7.0 mm Number of attempts: 1 Placement Confirmation: ETT inserted through vocal cords under direct vision,  positive ETCO2 and breath sounds checked- equal and bilateral Tube secured with: Tape Dental Injury: Teeth and Oropharynx as per pre-operative assessment

## 2018-11-26 ENCOUNTER — Encounter: Payer: Self-pay | Admitting: Otolaryngology

## 2018-11-27 LAB — SURGICAL PATHOLOGY

## 2018-12-31 ENCOUNTER — Ambulatory Visit (INDEPENDENT_AMBULATORY_CARE_PROVIDER_SITE_OTHER): Payer: 59

## 2018-12-31 DIAGNOSIS — Z23 Encounter for immunization: Secondary | ICD-10-CM

## 2019-01-07 ENCOUNTER — Telehealth: Payer: Self-pay | Admitting: Family Medicine

## 2019-01-07 NOTE — Telephone Encounter (Signed)
Patient's mother called and said patient needs documentation that she had her flu shot on 12/31/18.  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  Please call patient when documentation is ready for pick up.

## 2019-01-07 NOTE — Telephone Encounter (Signed)
Left message for Julie Mckenzie that documentation of her flu vaccine is ready to be picked up at the front desk.

## 2019-05-05 ENCOUNTER — Telehealth: Payer: Self-pay | Admitting: Family Medicine

## 2019-05-05 NOTE — Telephone Encounter (Signed)
Pt called needing a copy of her immunization records Please call when ready for pick up

## 2019-05-05 NOTE — Telephone Encounter (Signed)
Farrie notified by telephone that her vaccine record is ready to be picked up at the front desk.

## 2019-07-01 ENCOUNTER — Telehealth: Payer: Self-pay | Admitting: Family Medicine

## 2019-07-02 NOTE — Telephone Encounter (Signed)
Please call and schedule a CPE with Dr. Ermalene Searing.

## 2019-07-02 NOTE — Telephone Encounter (Signed)
cpx 4/16

## 2019-07-14 DIAGNOSIS — Z6826 Body mass index (BMI) 26.0-26.9, adult: Secondary | ICD-10-CM | POA: Diagnosis not present

## 2019-07-14 DIAGNOSIS — Z01419 Encounter for gynecological examination (general) (routine) without abnormal findings: Secondary | ICD-10-CM | POA: Diagnosis not present

## 2019-07-14 DIAGNOSIS — Z113 Encounter for screening for infections with a predominantly sexual mode of transmission: Secondary | ICD-10-CM | POA: Diagnosis not present

## 2019-07-14 LAB — HM PAP SMEAR: HM Pap smear: NEGATIVE

## 2019-07-23 ENCOUNTER — Other Ambulatory Visit: Payer: Self-pay

## 2019-07-23 ENCOUNTER — Encounter: Payer: Self-pay | Admitting: Family Medicine

## 2019-07-23 ENCOUNTER — Ambulatory Visit (INDEPENDENT_AMBULATORY_CARE_PROVIDER_SITE_OTHER): Payer: 59 | Admitting: Family Medicine

## 2019-07-23 VITALS — BP 110/68 | HR 74 | Temp 98.2°F | Ht 65.0 in | Wt 160.5 lb

## 2019-07-23 DIAGNOSIS — F411 Generalized anxiety disorder: Secondary | ICD-10-CM

## 2019-07-23 DIAGNOSIS — Z0000 Encounter for general adult medical examination without abnormal findings: Secondary | ICD-10-CM

## 2019-07-23 DIAGNOSIS — G43009 Migraine without aura, not intractable, without status migrainosus: Secondary | ICD-10-CM | POA: Diagnosis not present

## 2019-07-23 MED ORDER — SERTRALINE HCL 50 MG PO TABS: 50.0000 mg | ORAL_TABLET | Freq: Every day | ORAL | 3 refills | Status: DC

## 2019-07-23 NOTE — Assessment & Plan Note (Signed)
Good control on topamax. 

## 2019-07-23 NOTE — Progress Notes (Signed)
Chief Complaint  Patient presents with  . Annual Exam    History of Present Illness: HPI   The patient is here for annual wellness exam and preventative care.   Doing well overall.  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 In last few months her symptoms have reworsened.  She has noted anxiety, excessive worrying, she has trouble relaxing.  She has some issues with sleep... mainly because she works night shifts. GAD7:13 PHQ2 : 1  Exercise: 4-5 days a week  Diet: healthy diet  She has gained some weight in the last year Wt Readings from Last 3 Encounters:  07/23/19 160 lb 8 oz (72.8 kg)  11/25/18 154 lb (69.9 kg)  06/12/18 154 lb 4 oz (70 kg)   Had tonsils taken out in 11/2018  Migraine with out Aura:  She has had one migraine every few month. On Topamax 150 mg daily Using Imitrex for acute treatment with success. Has Mirena for birth control.  This visit occurred during the SARS-CoV-2 public health emergency.  Safety protocols were in place, including screening questions prior to the visit, additional usage of staff PPE, and extensive cleaning of exam room while observing appropriate contact time as indicated for disinfecting solutions.   COVID 19 screen:  No recent travel or known exposure to COVID19 The patient denies respiratory symptoms of COVID 19 at this time. The importance of social distancing was discussed today.     ROS    Past Medical History:  Diagnosis Date  . Acne   . Migraine headache without aura 03/01/2014  . Motion sickness    car passenger, boats  . Post-concussion headache     reports that she has never smoked. She has never used smokeless tobacco. She reports current alcohol use of about 1.0 standard drinks of alcohol per week. She reports that she does not use drugs.   Current Outpatient Medications:  .  levonorgestrel (MIRENA) 20 MCG/24HR IUD, 1 each by Intrauterine route once., Disp: , Rfl:  .   SUMAtriptan (IMITREX) 100 MG tablet, TAKE 1 TABLET (100 MG TOTAL) BY MOUTH EVERY 2 (TWO) HOURS AS NEEDED FOR MIGRAINE. MAY REPEAT IN 2 HOURS IF HEADACHE PERSISTS OR RECURS., Disp: 10 tablet, Rfl: 5 .  topiramate (TOPAMAX) 50 MG tablet, Take 3 tablets by mouth once daily, Disp: 90 tablet, Rfl: 0   Observations/Objective: Blood pressure 110/68, pulse 74, temperature 98.2 F (36.8 C), temperature source Temporal, height 5\' 5"  (1.651 m), weight 160 lb 8 oz (72.8 kg), SpO2 99 %.  Physical Exam Constitutional:      General: She is not in acute distress.    Appearance: Normal appearance. She is well-developed. She is not ill-appearing or toxic-appearing.  HENT:     Head: Normocephalic.     Right Ear: Hearing, tympanic membrane, ear canal and external ear normal.     Left Ear: Hearing, tympanic membrane, ear canal and external ear normal.     Nose: Nose normal.  Eyes:     General: Lids are normal. Lids are everted, no foreign bodies appreciated.     Conjunctiva/sclera: Conjunctivae normal.     Pupils: Pupils are equal, round, and reactive to light.  Neck:     Thyroid: No thyroid mass or thyromegaly.     Vascular: No carotid bruit.     Trachea: Trachea normal.  Cardiovascular:     Rate and Rhythm: Normal rate and regular rhythm.     Heart sounds:  Normal heart sounds, S1 normal and S2 normal. No murmur. No gallop.   Pulmonary:     Effort: Pulmonary effort is normal. No respiratory distress.     Breath sounds: Normal breath sounds. No wheezing, rhonchi or rales.  Abdominal:     General: Bowel sounds are normal. There is no distension or abdominal bruit.     Palpations: Abdomen is soft. There is no fluid wave or mass.     Tenderness: There is no abdominal tenderness. There is no guarding or rebound.     Hernia: No hernia is present.  Musculoskeletal:     Cervical back: Normal range of motion and neck supple.  Lymphadenopathy:     Cervical: No cervical adenopathy.  Skin:    General: Skin  is warm and dry.     Findings: No rash.  Neurological:     Mental Status: She is alert.     Cranial Nerves: No cranial nerve deficit.     Sensory: No sensory deficit.  Psychiatric:        Mood and Affect: Mood is not anxious or depressed.        Speech: Speech normal.        Behavior: Behavior normal. Behavior is cooperative.        Judgment: Judgment normal.      Assessment and Plan The patient's preventative maintenance and recommended screening tests for an annual wellness exam were reviewed in full today. Brought up to date unless services declined.  Counselled on the importance of diet, exercise, and its role in overall health and mortality. The patient's FH and SH was reviewed, including their home life, tobacco status, and drug and alcohol status.   Vaccines: uptodate with tdap and flu, COVID19 Pap/DVE:  [facility].. last nml pap (HPV not tested given age) in 01/2017.. repeat in 3 years Mammo: not indicated Smoking Status: none ETOH/ drug use:1 glass wine occ/ none HIV screen:  has had in past, denies need for STD testing.      Eliezer Lofts, MD

## 2019-07-23 NOTE — Patient Instructions (Signed)
Preventive Care 21-25 Years Old, Female Preventive care refers to visits with your health care provider and lifestyle choices that can promote health and wellness. This includes:  A yearly physical exam. This may also be called an annual well check.  Regular dental visits and eye exams.  Immunizations.  Screening for certain conditions.  Healthy lifestyle choices, such as eating a healthy diet, getting regular exercise, not using drugs or products that contain nicotine and tobacco, and limiting alcohol use. What can I expect for my preventive care visit? Physical exam Your health care provider will check your:  Height and weight. This may be used to calculate body mass index (BMI), which tells if you are at a healthy weight.  Heart rate and blood pressure.  Skin for abnormal spots. Counseling Your health care provider may ask you questions about your:  Alcohol, tobacco, and drug use.  Emotional well-being.  Home and relationship well-being.  Sexual activity.  Eating habits.  Work and work environment.  Method of birth control.  Menstrual cycle.  Pregnancy history. What immunizations do I need?  Influenza (flu) vaccine  This is recommended every year. Tetanus, diphtheria, and pertussis (Tdap) vaccine  You may need a Td booster every 10 years. Varicella (chickenpox) vaccine  You may need this if you have not been vaccinated. Human papillomavirus (HPV) vaccine  If recommended by your health care provider, you may need three doses over 6 months. Measles, mumps, and rubella (MMR) vaccine  You may need at least one dose of MMR. You may also need a second dose. Meningococcal conjugate (MenACWY) vaccine  One dose is recommended if you are age 19-21 years and a first-year college student living in a residence hall, or if you have one of several medical conditions. You may also need additional booster doses. Pneumococcal conjugate (PCV13) vaccine  You may need  this if you have certain conditions and were not previously vaccinated. Pneumococcal polysaccharide (PPSV23) vaccine  You may need one or two doses if you smoke cigarettes or if you have certain conditions. Hepatitis A vaccine  You may need this if you have certain conditions or if you travel or work in places where you may be exposed to hepatitis A. Hepatitis B vaccine  You may need this if you have certain conditions or if you travel or work in places where you may be exposed to hepatitis B. Haemophilus influenzae type b (Hib) vaccine  You may need this if you have certain conditions. You may receive vaccines as individual doses or as more than one vaccine together in one shot (combination vaccines). Talk with your health care provider about the risks and benefits of combination vaccines. What tests do I need?  Blood tests  Lipid and cholesterol levels. These may be checked every 5 years starting at age 20.  Hepatitis C test.  Hepatitis B test. Screening  Diabetes screening. This is done by checking your blood sugar (glucose) after you have not eaten for a while (fasting).  Sexually transmitted disease (STD) testing.  BRCA-related cancer screening. This may be done if you have a family history of breast, ovarian, tubal, or peritoneal cancers.  Pelvic exam and Pap test. This may be done every 3 years starting at age 21. Starting at age 30, this may be done every 5 years if you have a Pap test in combination with an HPV test. Talk with your health care provider about your test results, treatment options, and if necessary, the need for more tests.   Follow these instructions at home: Eating and drinking   Eat a diet that includes fresh fruits and vegetables, whole grains, lean protein, and low-fat dairy.  Take vitamin and mineral supplements as recommended by your health care provider.  Do not drink alcohol if: ? Your health care provider tells you not to drink. ? You are  pregnant, may be pregnant, or are planning to become pregnant.  If you drink alcohol: ? Limit how much you have to 0-1 drink a day. ? Be aware of how much alcohol is in your drink. In the [place] one drink equals one 12 oz bottle of beer (355 mL), one 5 oz glass of wine (148 mL), or one 1 oz glass of hard liquor (44 mL). Lifestyle  Take daily care of your teeth and gums.  Stay active. Exercise for at least 30 minutes on 5 or more days each week.  Do not use any products that contain nicotine or tobacco, such as cigarettes, e-cigarettes, and chewing tobacco. If you need help quitting, ask your health care provider.  If you are sexually active, practice safe sex. Use a condom or other form of birth control (contraception) in order to prevent pregnancy and STIs (sexually transmitted infections). If you plan to become pregnant, see your health care provider for a preconception visit. What's next?  Visit your health care provider once a year for a well check visit.  Ask your health care provider how often you should have your eyes and teeth checked.  Stay up to date on all vaccines. This information is not intended to replace advice given to you by your health care provider. Make sure you discuss any questions you have with your health care provider. Document Revised: 12/04/2017 Document Reviewed: 12/04/2017 Elsevier Patient Education  2020 [employer].

## 2019-07-23 NOTE — Assessment & Plan Note (Signed)
Start low dose sertraline daily. She will look into restarting counseling.

## 2019-08-07 ENCOUNTER — Other Ambulatory Visit: Payer: Self-pay

## 2019-08-07 ENCOUNTER — Ambulatory Visit (HOSPITAL_COMMUNITY): Admission: EM | Admit: 2019-08-07 | Discharge: 2019-08-07 | Discharge status: Against Medical Advice | Payer: 59

## 2019-08-07 NOTE — ED Notes (Signed)
Per Moshe Cipro, NP, patient needs proof of pet vaccinations prior to being seen by a provider. Pt leaving to obtain vaccination history and will return.

## 2019-08-10 ENCOUNTER — Ambulatory Visit: Payer: 59 | Admitting: Family Medicine

## 2019-08-10 ENCOUNTER — Encounter: Payer: Self-pay | Admitting: Family Medicine

## 2019-08-10 ENCOUNTER — Other Ambulatory Visit: Payer: Self-pay

## 2019-08-10 VITALS — BP 130/80 | HR 79 | Temp 98.3°F | Ht 65.0 in | Wt 158.0 lb

## 2019-08-10 DIAGNOSIS — S61451A Open bite of right hand, initial encounter: Secondary | ICD-10-CM | POA: Diagnosis not present

## 2019-08-10 DIAGNOSIS — S61259A Open bite of unspecified finger without damage to nail, initial encounter: Secondary | ICD-10-CM | POA: Diagnosis not present

## 2019-08-10 DIAGNOSIS — L089 Local infection of the skin and subcutaneous tissue, unspecified: Secondary | ICD-10-CM | POA: Insufficient documentation

## 2019-08-10 DIAGNOSIS — W540XXA Bitten by dog, initial encounter: Secondary | ICD-10-CM | POA: Diagnosis not present

## 2019-08-10 DIAGNOSIS — S61452A Open bite of left hand, initial encounter: Secondary | ICD-10-CM | POA: Diagnosis not present

## 2019-08-10 MED ORDER — DOXYCYCLINE HYCLATE 100 MG PO TABS: 100.0000 mg | ORAL_TABLET | Freq: Two times a day (BID) | ORAL | 0 refills | Status: DC

## 2019-08-10 MED ORDER — METRONIDAZOLE 500 MG PO TABS: 500.0000 mg | ORAL_TABLET | Freq: Three times a day (TID) | ORAL | 0 refills | Status: DC

## 2019-08-10 NOTE — Assessment & Plan Note (Signed)
Significant contusion as well as concern for soft tissue infection.  Treat with doxy to cover MRSA ( given her job  [place] and [place] as well as metronidazole to broaden coverage. Recommendations on antibiotic per Uptodate.  Close follow up in 3 days.

## 2019-08-10 NOTE — Progress Notes (Signed)
Chief Complaint  Patient presents with  . Animal Bite    Saturday evening    History of Present Illness: HPI  24 year old female presents following a dog bite 4 days ago.   She reports  She was cleaning her dog.  He snapped at her and jumped toward her face and he bit  Both hands... punctures She tried to clean the punctures initially. Treated with alcohol. 3-4 punctures  on both hands  She has noted increased redness and swelling in left hand centrally.  Last night at work she noted yellowish green bloody discharge form left hand. She has some numbness in left posterior hand. She works with her hands as a Psychologist, sport and exercise. This is painful for her to use her left hand.   No fever, feels well otherwise.  Given aggressive behavior of dog... vet recommended euthanization   Last Td 2018 This visit occurred during the SARS-CoV-2 public health emergency.  Safety protocols were in place, including screening questions prior to the visit, additional usage of staff PPE, and extensive cleaning of exam room while observing appropriate contact time as indicated for disinfecting solutions.   COVID 19 screen:  No recent travel or known exposure to COVID19 The patient denies respiratory symptoms of COVID 19 at this time. The importance of social distancing was discussed today.     Review of Systems  Constitutional: Negative for chills and fever.  HENT: Negative for congestion and ear pain.   Eyes: Negative for pain and redness.  Respiratory: Negative for cough and shortness of breath.   Cardiovascular: Negative for chest pain, palpitations and leg swelling.  Gastrointestinal: Negative for abdominal pain, blood in stool, constipation, diarrhea, nausea and vomiting.  Genitourinary: Negative for dysuria.  Musculoskeletal: Negative for falls and myalgias.  Skin: Negative for rash.  Neurological: Negative for dizziness.  Psychiatric/Behavioral: Negative for depression. The patient is not  nervous/anxious.       Past Medical History:  Diagnosis Date  . Acne   . Migraine headache without aura 03/01/2014  . Motion sickness    car passenger, boats  . Post-concussion headache     reports that she has never smoked. She has never used smokeless tobacco. She reports current alcohol use of about 1.0 standard drinks of alcohol per week. She reports that she does not use drugs.   Current Outpatient Medications:  .  levonorgestrel (MIRENA) 20 MCG/24HR IUD, 1 each by Intrauterine route once., Disp: , Rfl:  .  sertraline (ZOLOFT) 50 MG tablet, Take 1 tablet (50 mg total) by mouth daily., Disp: 30 tablet, Rfl: 3 .  SUMAtriptan (IMITREX) 100 MG tablet, TAKE 1 TABLET (100 MG TOTAL) BY MOUTH EVERY 2 (TWO) HOURS AS NEEDED FOR MIGRAINE. MAY REPEAT IN 2 HOURS IF HEADACHE PERSISTS OR RECURS., Disp: 10 tablet, Rfl: 5 .  topiramate (TOPAMAX) 50 MG tablet, Take 3 tablets by mouth once daily, Disp: 90 tablet, Rfl: 0   Observations/Objective: Blood pressure 130/80, pulse 79, temperature 98.3 F (36.8 C), temperature source Temporal, height 5\' 5"  (1.651 m), weight 158 lb (71.7 kg), SpO2 99 %.  Physical Exam Skin:    Comments: bilateral hands with contusions and multiple puncture wounds  on left hand one dorsal puncture wound with surrounding erythema and warmth  full ROM of B  fingers and hand  Neurological:     Mental Status: She is oriented to person, place, and time.     Cranial Nerves: Cranial nerves are intact.     Motor:  Motor function is intact.     Comments: Slight decreased sensation left posterior hand      Assessment and Plan  Dog bite of left hand including fingers with infection  Significant contusion as well as concern for soft tissue infection.  Treat with doxy to cover MRSA ( given her job  In [place] as well as metronidazole to broaden coverage. Recommendations on antibiotic per Uptodate.  Close follow up in 3 days.   Eliezer Lofts, MD

## 2019-08-10 NOTE — Patient Instructions (Addendum)
Reschedule May 14th appt for this Friday.  Complete both 7 day courses of antibiotics, ice and elevated hand.  Can use tylenol for pain.  Remain out of work until at least 08/13/2019  Call if redness and pain increasing, or fever on antibiotics.

## 2019-08-13 ENCOUNTER — Encounter: Payer: Self-pay | Admitting: Family Medicine

## 2019-08-13 ENCOUNTER — Other Ambulatory Visit: Payer: Self-pay

## 2019-08-13 ENCOUNTER — Ambulatory Visit: Payer: 59 | Admitting: Family Medicine

## 2019-08-13 VITALS — BP 120/78 | HR 80 | Temp 98.2°F | Ht 65.0 in | Wt 154.5 lb

## 2019-08-13 DIAGNOSIS — W540XXA Bitten by dog, initial encounter: Secondary | ICD-10-CM

## 2019-08-13 DIAGNOSIS — S61452A Open bite of left hand, initial encounter: Secondary | ICD-10-CM | POA: Diagnosis not present

## 2019-08-13 DIAGNOSIS — S61259A Open bite of unspecified finger without damage to nail, initial encounter: Secondary | ICD-10-CM | POA: Diagnosis not present

## 2019-08-13 DIAGNOSIS — L089 Local infection of the skin and subcutaneous tissue, unspecified: Secondary | ICD-10-CM | POA: Diagnosis not present

## 2019-08-13 NOTE — Progress Notes (Signed)
Chief Complaint  Patient presents with  . Follow-up    Dog bites bilateral hands    History of Present Illness: HPI  24 year old female presents for follow up on  Dog bite to bilateral hands with associated cellulitis on dorsal left hand.  She reports that the redness and swelling are significantly better on day 3/10 of doxycyline.  She also has improved mobility in left hand. She still has fairly significant pain in left hand with movement and continue contusion.  There is still a small area of numbness distal to puncture wound on left dorsal hands  No fever. No SE to antibiotics.  no yeast infeciton, no D/C  This visit occurred during the SARS-CoV-2 public health emergency.  Safety protocols were in place, including screening questions prior to the visit, additional usage of staff PPE, and extensive cleaning of exam room while observing appropriate contact time as indicated for disinfecting solutions.   COVID 19 screen:  No recent travel or known exposure to COVID19 The patient denies respiratory symptoms of COVID 19 at this time. The importance of social distancing was discussed today.     Review of Systems  Constitutional: Negative for chills and fever.  HENT: Negative for congestion and ear pain.   Eyes: Negative for pain and redness.  Respiratory: Negative for cough and shortness of breath.   Cardiovascular: Negative for chest pain, palpitations and leg swelling.  Gastrointestinal: Negative for abdominal pain, blood in stool, constipation, diarrhea, nausea and vomiting.  Genitourinary: Negative for dysuria.  Musculoskeletal: Negative for falls and myalgias.  Skin: Negative for rash.  Neurological: Negative for dizziness.  Psychiatric/Behavioral: Negative for depression. The patient is not nervous/anxious.       Past Medical History:  Diagnosis Date  . Acne   . Migraine headache without aura 03/01/2014  . Motion sickness    car passenger, boats  . Post-concussion  headache     reports that she has never smoked. She has never used smokeless tobacco. She reports current alcohol use of about 1.0 standard drinks of alcohol per week. She reports that she does not use drugs.   Current Outpatient Medications:  .  doxycycline (VIBRA-TABS) 100 MG tablet, Take 1 tablet (100 mg total) by mouth 2 (two) times daily., Disp: 14 tablet, Rfl: 0 .  levonorgestrel (MIRENA) 20 MCG/24HR IUD, 1 each by Intrauterine route once., Disp: , Rfl:  .  metroNIDAZOLE (FLAGYL) 500 MG tablet, Take 1 tablet (500 mg total) by mouth 3 (three) times daily., Disp: 21 tablet, Rfl: 0 .  sertraline (ZOLOFT) 50 MG tablet, Take 1 tablet (50 mg total) by mouth daily., Disp: 30 tablet, Rfl: 3 .  SUMAtriptan (IMITREX) 100 MG tablet, TAKE 1 TABLET (100 MG TOTAL) BY MOUTH EVERY 2 (TWO) HOURS AS NEEDED FOR MIGRAINE. MAY REPEAT IN 2 HOURS IF HEADACHE PERSISTS OR RECURS., Disp: 10 tablet, Rfl: 5 .  topiramate (TOPAMAX) 50 MG tablet, Take 3 tablets by mouth once daily, Disp: 90 tablet, Rfl: 0   Observations/Objective: Blood pressure 120/78, pulse 80, temperature 98.2 F (36.8 C), temperature source Temporal, height 5\' 5"  (1.651 m), weight 154 lb 8 oz (70.1 kg), SpO2 99 %.  Physical Exam Constitutional:      General: She is not in acute distress.    Appearance: Normal appearance. She is well-developed. She is not ill-appearing or toxic-appearing.  HENT:     Head: Normocephalic.     Right Ear: Hearing, tympanic membrane, ear canal and external ear normal. Tympanic  membrane is not erythematous, retracted or bulging.     Left Ear: Hearing, tympanic membrane, ear canal and external ear normal. Tympanic membrane is not erythematous, retracted or bulging.     Nose: No mucosal edema or rhinorrhea.     Right Sinus: No maxillary sinus tenderness or frontal sinus tenderness.     Left Sinus: No maxillary sinus tenderness or frontal sinus tenderness.     Mouth/Throat:     Pharynx: Uvula midline.  Eyes:      General: Lids are normal. Lids are everted, no foreign bodies appreciated.     Conjunctiva/sclera: Conjunctivae normal.     Pupils: Pupils are equal, round, and reactive to light.  Neck:     Thyroid: No thyroid mass or thyromegaly.     Vascular: No carotid bruit.     Trachea: Trachea normal.  Cardiovascular:     Rate and Rhythm: Normal rate and regular rhythm.     Pulses: Normal pulses.     Heart sounds: Normal heart sounds, S1 normal and S2 normal. No murmur. No friction rub. No gallop.   Pulmonary:     Effort: Pulmonary effort is normal. No tachypnea or respiratory distress.     Breath sounds: Normal breath sounds. No decreased breath sounds, wheezing, rhonchi or rales.  Abdominal:     General: Bowel sounds are normal.     Palpations: Abdomen is soft.     Tenderness: There is no abdominal tenderness.  Musculoskeletal:     Cervical back: Normal range of motion and neck supple.  Skin:    General: Skin is warm and dry.     Findings: No rash.     Comments: bilateral hands with contusions and multiple puncture wounds  on left hand one dorsal puncture wound with LESS surrounding erythema and no increased warmth Singificantly less swelling dorsal hands, now with easier less painful grip, full ROM of B  fingers and hand  Neurological:     Mental Status: She is alert and oriented to person, place, and time.     Cranial Nerves: Cranial nerves are intact.     Motor: Motor function is intact.     Comments: Slight decreased sensation left posterior hand  Psychiatric:        Mood and Affect: Mood is not anxious or depressed.        Speech: Speech normal.        Behavior: Behavior normal. Behavior is cooperative.        Thought Content: Thought content normal.        Judgment: Judgment normal.      Assessment and Plan    Dog bite of left hand including fingers with infection Resolving infection.. complete antibiotics.  Still with significant pain likely from soft tissue injury.  If  pain not continuing to improve consider X-ray to eval for additional injury/ doubt osteomelitis or referral to hand specialist.  pt refuses X-ray today, wishes to give it more time to heal. Plan work return  On 5/10. She will wear pull an neoprene hand brace/glove    Kerby Nora, MD

## 2019-08-13 NOTE — Assessment & Plan Note (Signed)
Resolving infection.. complete antibiotics.  Still with significant pain likely from soft tissue injury.  If pain not continuing to improve consider X-ray to eval for additional injury/ doubt osteomelitis or referral to hand specialist.  pt refuses X-ray today, wishes to give it more time to heal. Plan work return  On 5/10. She will wear pull an neoprene hand brace/glove

## 2019-08-13 NOTE — Patient Instructions (Addendum)
Continue to elevate hand.  Complete the antibiotics.  Call if pain , swelling and redness not continuing to improve.

## 2019-08-20 ENCOUNTER — Ambulatory Visit: Payer: 59 | Admitting: Family Medicine

## 2019-09-16 ENCOUNTER — Other Ambulatory Visit: Payer: Self-pay

## 2019-09-16 MED ORDER — TOPIRAMATE 50 MG PO TABS: 150.0000 mg | ORAL_TABLET | Freq: Every day | ORAL | 0 refills | Status: DC

## 2019-09-16 NOTE — Telephone Encounter (Signed)
Pt left v/m requesting refill topamax to [facility]. Name of Medication: tompamax 50 mg Name of Pharmacy: [facility] [address] Last Fill or Written Date and Quantity: # 90 on 07/02/19 Last Office Visit and Type: 08/13/19 acute and 07/23/19 annual Next Office Visit and Type:none scheduled

## 2019-10-13 ENCOUNTER — Other Ambulatory Visit: Payer: Self-pay | Admitting: Family Medicine

## 2019-11-01 NOTE — Telephone Encounter (Signed)
Pt calling to ck on status of refill for topamax; pt said she had not heard from [facility] [address]. I spoke with Morrie Sheldon at [facility] [address] and they did receive the topamax refill on 10/13/19 but ins requires med to be filled at [facility]. Morrie Sheldon said pt should have been notified to have [facility] pharmacy call [facility] [address] [facility] and have rx transferred to [facility]. Pt said she did not understand that but pt will contact [facility] pharmacy to get Topamax refilled.[place] at front desk will schedule pt FU med appt for sertraline. Pt appreciative and nothing further needed from me. Pt will cb if problems getting refill transferred.

## 2019-11-11 ENCOUNTER — Encounter: Payer: Self-pay | Admitting: Family Medicine

## 2019-11-11 ENCOUNTER — Other Ambulatory Visit: Payer: Self-pay

## 2019-11-11 ENCOUNTER — Ambulatory Visit: Payer: 59 | Admitting: Family Medicine

## 2019-11-11 ENCOUNTER — Other Ambulatory Visit: Payer: Self-pay | Admitting: Family Medicine

## 2019-11-11 VITALS — BP 112/82 | HR 76 | Temp 98.5°F | Ht 65.0 in | Wt 166.5 lb

## 2019-11-11 DIAGNOSIS — G43009 Migraine without aura, not intractable, without status migrainosus: Secondary | ICD-10-CM | POA: Diagnosis not present

## 2019-11-11 DIAGNOSIS — F411 Generalized anxiety disorder: Secondary | ICD-10-CM | POA: Diagnosis not present

## 2019-11-11 MED ORDER — TOPIRAMATE 200 MG PO TABS: 200.0000 mg | ORAL_TABLET | Freq: Every day | ORAL | 3 refills | Status: DC

## 2019-11-11 MED ORDER — SERTRALINE HCL 100 MG PO TABS: 100.0000 mg | ORAL_TABLET | Freq: Every day | ORAL | 3 refills | Status: DC

## 2019-11-11 NOTE — Progress Notes (Signed)
Chief Complaint  Patient presents with   Medication Management    History of Present Illness: HPI  24 year old female presents for  follow up on medicaitons.   She has been on sertraline for several month... she has noted mild improvement in  GAD.  She still has anxiety, through out the day. No discrete panic attacks.   She gets fixated on things and worry about things. She has the most issue with difficulty falling asleep.. cannot stop thinking about things.  No frequent waking. She feels that she is hyperfixated on list.  She finds difficult concentrating on things when she is anxious.   No SE to this medication. Not seeing a counselor.  She has noted increase in migraine in last 1-2 months..  3 migraines in last month.  Has mirena in place  This visit occurred during the SARS-CoV-2 public health emergency.  Safety protocols were in place, including screening questions prior to the visit, additional usage of staff PPE, and extensive cleaning of exam room while observing appropriate contact time as indicated for disinfecting solutions.   COVID 19 screen:  No recent travel or known exposure to COVID19 The patient denies respiratory symptoms of COVID 19 at this time. The importance of social distancing was discussed today.     Review of Systems  Constitutional: Negative for chills and fever.  HENT: Negative for congestion and ear pain.   Eyes: Negative for pain and redness.  Respiratory: Negative for cough and shortness of breath.   Cardiovascular: Negative for chest pain, palpitations and leg swelling.  Gastrointestinal: Negative for abdominal pain, blood in stool, constipation, diarrhea, nausea and vomiting.  Genitourinary: Negative for dysuria.  Musculoskeletal: Negative for falls and myalgias.  Skin: Negative for rash.  Neurological: Negative for dizziness.  Psychiatric/Behavioral: Negative for depression. The patient is not nervous/anxious.       Past Medical  History:  Diagnosis Date   Acne    Migraine headache without aura 03/01/2014   Motion sickness    car passenger, boats   Post-concussion headache     reports that she has never smoked. She has never used smokeless tobacco. She reports current alcohol use of about 1.0 standard drink of alcohol per week. She reports that she does not use drugs.   Current Outpatient Medications:    levonorgestrel (MIRENA) 20 MCG/24HR IUD, 1 each by Intrauterine route once., Disp: , Rfl:    sertraline (ZOLOFT) 50 MG tablet, Take 1 tablet (50 mg total) by mouth daily., Disp: 30 tablet, Rfl: 3   SUMAtriptan (IMITREX) 100 MG tablet, TAKE 1 TABLET (100 MG TOTAL) BY MOUTH EVERY 2 (TWO) HOURS AS NEEDED FOR MIGRAINE. MAY REPEAT IN 2 HOURS IF HEADACHE PERSISTS OR RECURS., Disp: 10 tablet, Rfl: 5   topiramate (TOPAMAX) 50 MG tablet, TAKE 3 TABLETS(150 MG) BY MOUTH DAILY, Disp: 90 tablet, Rfl: 5   Observations/Objective: Blood pressure 112/82, pulse 76, temperature 98.5 F (36.9 C), temperature source Temporal, height 5\' 5"  (1.651 m), weight 166 lb 8 oz (75.5 kg), SpO2 99 %.  Physical Exam Constitutional:      General: She is not in acute distress.    Appearance: Normal appearance. She is well-developed. She is not ill-appearing or toxic-appearing.  HENT:     Head: Normocephalic.     Right Ear: Hearing, tympanic membrane, ear canal and external ear normal. Tympanic membrane is not erythematous, retracted or bulging.     Left Ear: Hearing, tympanic membrane, ear canal and external ear normal.  Tympanic membrane is not erythematous, retracted or bulging.     Nose: No mucosal edema or rhinorrhea.     Right Sinus: No maxillary sinus tenderness or frontal sinus tenderness.     Left Sinus: No maxillary sinus tenderness or frontal sinus tenderness.     Mouth/Throat:     Pharynx: Uvula midline.  Eyes:     General: Lids are normal. Lids are everted, no foreign bodies appreciated.     Conjunctiva/sclera:  Conjunctivae normal.     Pupils: Pupils are equal, round, and reactive to light.  Neck:     Thyroid: No thyroid mass or thyromegaly.     Vascular: No carotid bruit.     Trachea: Trachea normal.  Cardiovascular:     Rate and Rhythm: Normal rate and regular rhythm.     Pulses: Normal pulses.     Heart sounds: Normal heart sounds, S1 normal and S2 normal. No murmur heard.  No friction rub. No gallop.   Pulmonary:     Effort: Pulmonary effort is normal. No tachypnea or respiratory distress.     Breath sounds: Normal breath sounds. No decreased breath sounds, wheezing, rhonchi or rales.  Abdominal:     General: Bowel sounds are normal.     Palpations: Abdomen is soft.     Tenderness: There is no abdominal tenderness.  Musculoskeletal:     Cervical back: Normal range of motion and neck supple.  Skin:    General: Skin is warm and dry.     Findings: No rash.  Neurological:     Mental Status: She is alert and oriented to person, place, and time.     GCS: GCS eye subscore is 4. GCS verbal subscore is 5. GCS motor subscore is 6.     Cranial Nerves: No cranial nerve deficit.     Sensory: No sensory deficit.     Motor: No abnormal muscle tone.     Coordination: Coordination normal.     Gait: Gait normal.     Deep Tendon Reflexes: Reflexes are normal and symmetric.     Comments: Nml cerebellar exam   No papilledema  Psychiatric:        Mood and Affect: Mood is not anxious or depressed.        Speech: Speech normal.        Behavior: Behavior normal. Behavior is cooperative.        Thought Content: Thought content normal.        Cognition and Memory: Memory is not impaired. She does not exhibit impaired recent memory or impaired remote memory.        Judgment: Judgment normal.      Assessment and Plan Generalized anxiety disorder Trail of increase in sertraline to 100 mg at bedtime. Referral to psychology for stress reduction, relaxation. Follow up in 4 weeks.  Migraine headache  without aura Likely due to GAD and increase in stress. Increase topamax to 200 mg ... if not improving will refer to headache wellness center.       Kerby Nora, MD

## 2019-11-11 NOTE — Assessment & Plan Note (Signed)
Likely due to GAD and increase in stress. Increase topamax to 200 mg ... if not improving will refer to headache wellness center.

## 2019-11-11 NOTE — Patient Instructions (Addendum)
Increase sertraline to 100 mg daily. Call regarding counselor.  Increase Topamax to 200 mg daily.   Managing Stress, Adult Feeling a certain amount of stress is normal. Stress helps our body and mind get ready to deal with the demands of life. Stress hormones can motivate you to do well at work and meet your responsibilities. However severe or long-lasting (chronic) stress can affect your mental and physical health. Chronic stress puts you at higher risk for anxiety, depression, and other health problems like digestive problems, muscle aches, heart disease, high blood pressure, and stroke. What are the causes? Common causes of stress include:  Demands from work, such as deadlines, feeling overworked, or having long hours.  Pressures at home, such as money issues, disagreements with a spouse, or parenting issues.  Pressures from major life changes, such as divorce, moving, loss of a loved one, or chronic illness. You may be at higher risk for stress-related problems if you do not get enough sleep, are in poor health, do not have emotional support, or have a mental health disorder like anxiety or depression. How to recognize stress Stress can make you:  Have trouble sleeping.  Feel sad, anxious, irritable, or overwhelmed.  Lose your appetite.  Overeat or want to eat unhealthy foods.  Want to use drugs or alcohol. Stress can also cause physical symptoms, such as:  Sore, tense muscles, especially in the shoulders and neck.  Headaches.  Trouble breathing.  A faster heart rate.  Stomach pain, nausea, or vomiting.  Diarrhea or constipation.  Trouble concentrating. Follow these instructions at home: Lifestyle  Identify the source of your stress and your reaction to it. See a therapist who can help you change your reactions.  When there are stressful events: ? Talk about it with family, friends, or co-workers. ? Try to think realistically about stressful events and not  ignore them or overreact. ? Try to find the positives in a stressful situation and not focus on the negatives. ? Cut back on responsibilities at work and home, if possible. Ask for help from friends or family members if you need it.  Find ways to cope with stress, such as: ? Meditation. ? Deep breathing. ? Yoga or tai chi. ? Progressive muscle relaxation. ? Doing art, playing music, or reading. ? Making time for fun activities. ? Spending time with family and friends.  Get support from family, friends, or spiritual resources. Eating and drinking  Eat a healthy diet. This includes: ? Eating foods that are high in fiber, such as beans, whole grains, and fresh fruits and vegetables. ? Limiting foods that are high in fat and processed sugars, such as fried and sweet foods.  Do not skip meals or overeat.  Drink enough fluid to keep your urine pale yellow. Alcohol use  Do not drink alcohol if: ? Your health care provider tells you not to drink. ? You are pregnant, may be pregnant, or are planning to become pregnant.  Drinking alcohol is a way some people try to ease their stress. This can be dangerous, so if you drink alcohol: ? Limit how much you use to:  0-1 drink a day for women.  0-2 drinks a day for men. ? Be aware of how much alcohol is in your drink. In the [place] one drink equals one 12 oz bottle of beer (355 mL), one 5 oz glass of wine (148 mL), or one 1 oz glass of hard liquor (44 mL). Activity   Include 30 minutes  of exercise in your daily schedule. Exercise is a good stress reducer.  Include time in your day for an activity that you find relaxing. Try taking a walk, going on a bike ride, reading a book, or listening to music.  Schedule your time in a way that lowers stress, and keep a consistent schedule. Prioritize what is most important to get done. General instructions  Get enough sleep. Try to go to sleep and get up at about the same time every day.  Take  over-the-counter and prescription medicines only as told by your health care provider.  Do not use any products that contain nicotine or tobacco, such as cigarettes, e-cigarettes, and chewing tobacco. If you need help quitting, ask your health care provider.  Do not use drugs or smoke to cope with stress.  Keep all follow-up visits as told by your health care provider. This is important. Where to find support  Talk with your health care provider about stress management or finding a support group.  Find a therapist to work with you on your stress management techniques. Contact a health care provider if:  Your stress symptoms get worse.  You are unable to manage your stress at home.  You are struggling to stop using drugs or alcohol. Get help right away if:  You may be a danger to yourself or others.  You have any thoughts of death or suicide. If you ever feel like you may hurt yourself or others, or have thoughts about taking your own life, get help right away. You can go to your nearest emergency department or call:  Your local emergency services (911 in the [place]  A suicide crisis helpline, such as the [facility] at 508-477-1814. This is open 24 hours a day. Summary  Feeling a certain amount of stress is normal, but severe or long-lasting (chronic) stress can affect your mental and physical health.  Chronic stress can put you at higher risk for anxiety, depression, and other health problems like digestive problems, muscle aches, heart disease, high blood pressure, and stroke.  You may be at higher risk for stress-related problems if you do not get enough sleep, are in poor health, lack emotional support, or have a mental health disorder like anxiety or depression.  Identify the source of your stress and your reaction to it. Try talking about stressful events with family, friends, or co-workers, finding a coping method, or getting support from  spiritual resources.  If you need more help, talk with your health care provider about finding a support group or a mental health therapist. This information is not intended to replace advice given to you by your health care provider. Make sure you discuss any questions you have with your health care provider. Document Revised: 10/21/2018 Document Reviewed: 10/21/2018 Elsevier Patient Education  [employer].

## 2019-11-11 NOTE — Assessment & Plan Note (Signed)
Trail of increase in sertraline to 100 mg at bedtime. Referral to psychology for stress reduction, relaxation. Follow up in 4 weeks.

## 2019-12-10 ENCOUNTER — Ambulatory Visit: Payer: 59 | Admitting: Family Medicine

## 2019-12-10 DIAGNOSIS — Z0289 Encounter for other administrative examinations: Secondary | ICD-10-CM

## 2020-02-05 ENCOUNTER — Other Ambulatory Visit: Payer: Self-pay

## 2020-02-05 ENCOUNTER — Ambulatory Visit: Payer: 59

## 2020-03-15 DIAGNOSIS — Z20822 Contact with and (suspected) exposure to covid-19: Secondary | ICD-10-CM | POA: Diagnosis not present

## 2020-04-04 ENCOUNTER — Other Ambulatory Visit: Payer: Self-pay

## 2020-04-04 DIAGNOSIS — Z20822 Contact with and (suspected) exposure to covid-19: Secondary | ICD-10-CM | POA: Diagnosis not present

## 2020-04-04 NOTE — Telephone Encounter (Signed)
Pt left v/m that she is leaving on cruise on 04/06/20 and pt is requesting scopolamine patch for motion sickness. Last filled on [facility] list # 4 patches on 12/11/2017.Please advise.

## 2020-04-05 DIAGNOSIS — Z20822 Contact with and (suspected) exposure to covid-19: Secondary | ICD-10-CM | POA: Diagnosis not present

## 2020-04-05 DIAGNOSIS — Z03818 Encounter for observation for suspected exposure to other biological agents ruled out: Secondary | ICD-10-CM | POA: Diagnosis not present

## 2020-04-05 MED ORDER — SCOPOLAMINE 1 MG/3DAYS TD PT72: 1.0000 | MEDICATED_PATCH | TRANSDERMAL | 0 refills | Status: DC

## 2020-04-05 NOTE — Telephone Encounter (Signed)
Called patient and let know she can pick up

## 2020-04-05 NOTE — Telephone Encounter (Signed)
Patient called to follow up on request she is going out of town tonight and wanted to know if she would get refill before she leaves.

## 2020-04-05 NOTE — Telephone Encounter (Signed)
Let pt know I have refilled her patches

## 2020-05-03 ENCOUNTER — Encounter (HOSPITAL_COMMUNITY): Payer: Self-pay

## 2020-05-03 ENCOUNTER — Emergency Department (HOSPITAL_COMMUNITY)
Admission: EM | Admit: 2020-05-03 | Discharge: 2020-05-04 | Disposition: A | Discharge status: Home / Self Care | Payer: No Typology Code available for payment source | Attending: Emergency Medicine | Admitting: Emergency Medicine

## 2020-05-03 ENCOUNTER — Emergency Department (HOSPITAL_COMMUNITY): Payer: No Typology Code available for payment source

## 2020-05-03 ENCOUNTER — Other Ambulatory Visit: Payer: Self-pay

## 2020-05-03 DIAGNOSIS — N390 Urinary tract infection, site not specified: Secondary | ICD-10-CM | POA: Insufficient documentation

## 2020-05-03 DIAGNOSIS — R319 Hematuria, unspecified: Secondary | ICD-10-CM

## 2020-05-03 DIAGNOSIS — R109 Unspecified abdominal pain: Secondary | ICD-10-CM | POA: Diagnosis present

## 2020-05-03 LAB — BASIC METABOLIC PANEL
Anion gap: 10 (ref 5–15)
BUN: 7 mg/dL (ref 6–20)
CO2: 23 mmol/L (ref 22–32)
Calcium: 9.1 mg/dL (ref 8.9–10.3)
Chloride: 106 mmol/L (ref 98–111)
Creatinine, Ser: 0.7 mg/dL (ref 0.44–1.00)
GFR, Estimated: >60 mL/min (ref >60)
Glucose, Bld: 99 mg/dL (ref 70–99)
Potassium: 3 mmol/L — ABNORMAL LOW (ref 3.5–5.1)
Sodium: 139 mmol/L (ref 135–145)

## 2020-05-03 LAB — CBC
HCT: 43.9 % (ref 36.0–46.0)
Hemoglobin: 14.4 g/dL (ref 12.0–15.0)
MCH: 30.1 pg (ref 26.0–34.0)
MCHC: 32.8 g/dL (ref 30.0–36.0)
MCV: 91.6 fL (ref 80.0–100.0)
Platelets: 215 10*3/uL (ref 150–400)
RBC: 4.79 MIL/uL (ref 3.87–5.11)
RDW: 12 % (ref 11.5–15.5)
WBC: 9.4 10*3/uL (ref 4.0–10.5)
nRBC: 0 % (ref 0.0–0.2)

## 2020-05-03 LAB — URINALYSIS, ROUTINE W REFLEX MICROSCOPIC
Bilirubin Urine: NEGATIVE
Glucose, UA: NEGATIVE mg/dL
Ketones, ur: NEGATIVE mg/dL
Nitrite: POSITIVE — AB
Protein, ur: 100 mg/dL — AB
Specific Gravity, Urine: 1.005 (ref 1.005–1.030)
WBC, UA: >50 WBC/hpf — ABNORMAL HIGH (ref 0–5)
pH: 5 (ref 5.0–8.0)

## 2020-05-03 LAB — I-STAT BETA HCG BLOOD, ED (MC, WL, AP ONLY): I-stat hCG, quantitative: <5 m[IU]/mL (ref <5)

## 2020-05-03 MED ORDER — HYDROCODONE-ACETAMINOPHEN 5-325 MG PO TABS: 1.0000 | ORAL_TABLET | ORAL | 0 refills | Status: AC | PRN

## 2020-05-03 MED ORDER — POTASSIUM CHLORIDE CRYS ER 20 MEQ PO TBCR
40.0000 meq | EXTENDED_RELEASE_TABLET | Freq: Once | ORAL | Status: AC
  Administered 2020-05-04: 40 meq via ORAL
  Filled 2020-05-03: qty 2

## 2020-05-03 MED ORDER — CEFDINIR 300 MG PO CAPS: 300.0000 mg | ORAL_CAPSULE | Freq: Two times a day (BID) | ORAL | 0 refills | Status: DC

## 2020-05-03 MED ORDER — CEFDINIR 300 MG PO CAPS
300.0000 mg | ORAL_CAPSULE | Freq: Two times a day (BID) | ORAL | Status: DC
  Administered 2020-05-04: 300 mg via ORAL
  Filled 2020-05-03: qty 1

## 2020-05-03 MED ORDER — OXYCODONE-ACETAMINOPHEN 5-325 MG PO TABS
1.0000 | ORAL_TABLET | Freq: Once | ORAL | Status: AC
  Administered 2020-05-03: 1 via ORAL
  Filled 2020-05-03: qty 1

## 2020-05-03 MED ORDER — ONDANSETRON 4 MG PO TBDP
4.0000 mg | ORAL_TABLET | Freq: Once | ORAL | Status: AC
  Administered 2020-05-03: 4 mg via ORAL
  Filled 2020-05-03: qty 1

## 2020-05-03 NOTE — ED Notes (Signed)
Patient asked to speak to a nurse, when this nurse approached patient she was crying stating she cannot sit in the lobby for 5 more hours. This nurse asked why she felt like she had 5 more hours and explained she was one of the next patients to go back. Patient increasingly upset and began yelling saying she did not have anything for pain and she needs something right now that shes going to have a nervous breakdown. This nurse tried to help patient take a few deep breaths and said we have been moving patients quickly tonight, that we have had extensive waits recently and other people in the lobby may not reflect what her wait time would be. Patient stating for this nurse to not talk down to her and belittle her. This nurse apologized for any misunderstanding and again informed we would be waiting for her room.

## 2020-05-03 NOTE — ED Notes (Signed)
intitial contact with pt. Pt states her pain is 11/10. Pt is on monitor x3.  VSS, will continue to monitor.

## 2020-05-03 NOTE — ED Triage Notes (Addendum)
Patient arrived stating she had a UTI in the beginning of January and finished her antibiotics two weeks ago. Reports she has continued to have symptoms inculding hematuria and started having back pain tonight. Declines taking any OTC medication prior to arrival.

## 2020-05-03 NOTE — ED Provider Notes (Signed)
[facility]-EMERGENCY DEPT Provider Note   CSN: [id number] Arrival date & time: 05/03/20  2035     History Chief Complaint  Patient presents with  . Urinary Tract Infection    Julie Mckenzie is a 25 y.o. female.  The history is provided by the patient. No language interpreter was used.  Urinary Tract Infection Pain quality:  Stabbing Pain severity:  Moderate Onset quality:  Gradual Duration:  2 weeks Timing:  Constant Progression:  Worsening Chronicity:  Recurrent Recent urinary tract infections: yes   Relieved by:  Nothing Worsened by:  Nothing Ineffective treatments:  None tried Associated symptoms: abdominal pain, flank pain and nausea        Past Medical History:  Diagnosis Date  . Acne   . Migraine headache without aura 03/01/2014  . Motion sickness    car passenger, boats  . Post-concussion headache     Patient Active Problem List   Diagnosis Date Noted  . Dog bite of left hand including fingers with infection 08/10/2019  . Dog bite, hand, right, initial encounter 08/10/2019  . Generalized anxiety disorder 12/19/2015  . Migraine headache without aura 03/01/2014  . Seasonal allergies 09/07/2013  . ACNE VULGARIS 08/09/2008    Past Surgical History:  Procedure Laterality Date  . ADENOIDECTOMY Bilateral 11/25/2018   Procedure: ADENOIDECTOMY;  Surgeon: Bud Face, MD;  Location: [facility];  Service: ENT;  Laterality: Bilateral;  . NO PAST SURGERIES    . TONSILLECTOMY Bilateral 11/25/2018   Procedure: TONSILLECTOMY;  Surgeon: Bud Face, MD;  Location: [facility];  Service: ENT;  Laterality: Bilateral;     OB History   No obstetric history on file.     Family History  Problem Relation Age of Onset  . Cancer Other        colon  . Cancer Paternal Grandmother        lung  . Hypertension Paternal Grandfather     Social History   Tobacco Use  . Smoking status: Never Smoker  . Smokeless  tobacco: Never Used  Vaping Use  . Vaping Use: Never used  Substance Use Topics  . Alcohol use: Yes    Alcohol/week: 1.0 standard drink    Types: 1 Standard drinks or equivalent per week  . Drug use: No    Home Medications Prior to Admission medications   Medication Sig Start Date End Date Taking? Authorizing Provider  levonorgestrel (MIRENA) 20 MCG/24HR IUD 1 each by Intrauterine route once.    [provider]  scopolamine (TRANSDERM-SCOP, 1.5 MG,) 1 MG/3DAYS Place 1 patch (1.5 mg total) onto the skin every 3 (three) days. 04/05/20   Bedsole, Amy E, MD  sertraline (ZOLOFT) 100 MG tablet Take 1 tablet (100 mg total) by mouth daily. 11/11/19   Bedsole, Amy E, MD  SUMAtriptan (IMITREX) 100 MG tablet TAKE 1 TABLET (100 MG TOTAL) BY MOUTH EVERY 2 (TWO) HOURS AS NEEDED FOR MIGRAINE. MAY REPEAT IN 2 HOURS IF HEADACHE PERSISTS OR RECURS. 08/28/18   Bedsole, Amy E, MD  topiramate (TOPAMAX) 200 MG tablet Take 1 tablet (200 mg total) by mouth at bedtime. 11/11/19   Excell Seltzer, MD    Allergies    Sulfa antibiotics and Toradol [ketorolac tromethamine]  Review of Systems   Review of Systems  Gastrointestinal: Positive for abdominal pain and nausea.  Genitourinary: Positive for flank pain.  All other systems reviewed and are negative.   Physical Exam Updated Vital Signs BP 128/69 (BP Location:  Right Arm)   Pulse 85   Temp 97.9 F (36.6 C) (Oral)   Resp 20   Ht 5\' 6"  (1.676 m)   Wt 74.8 kg   SpO2 99%   BMI 26.63 kg/m   Physical Exam Vitals reviewed.  Cardiovascular:     Rate and Rhythm: Normal rate.     Pulses: Normal pulses.  Pulmonary:     Effort: Pulmonary effort is normal.  Abdominal:     General: Abdomen is flat.     Tenderness: There is abdominal tenderness.  Musculoskeletal:        General: Normal range of motion.  Skin:    General: Skin is warm.  Neurological:     General: No focal deficit present.     Mental Status: She is alert.  Psychiatric:         Mood and Affect: Mood normal.     ED Results / Procedures / Treatments   Labs (all labs ordered are listed, but only abnormal results are displayed) Labs Reviewed  URINALYSIS, ROUTINE W REFLEX MICROSCOPIC - Abnormal; Notable for the following components:      Result Value   Color, Urine AMBER (*)    APPearance HAZY (*)    Hgb urine dipstick LARGE (*)    Protein, ur 100 (*)    Nitrite POSITIVE (*)    Leukocytes,Ua MODERATE (*)    WBC, UA >50 (*)    Bacteria, UA FEW (*)    All other components within normal limits  BASIC METABOLIC PANEL - Abnormal; Notable for the following components:   Potassium 3.0 (*)    All other components within normal limits  CBC  I-STAT BETA HCG BLOOD, ED ([facility], [facility], [facility] ONLY)    EKG None  Radiology No results found.  Procedures Procedures   Medications Ordered in ED Medications  ondansetron (ZOFRAN-ODT) disintegrating tablet 4 mg (4 mg Oral Given 05/03/20 2308)  oxyCODONE-acetaminophen (PERCOCET/ROXICET) 5-325 MG per tablet 1 tablet (1 tablet Oral Given 05/03/20 2308)    ED Course  I have reviewed the triage vital signs and the nursing notes.  Pertinent labs & imaging results that were available during my care of the patient were reviewed by me and considered in my medical decision making (see chart for details).    MDM Rules/Calculators/A&P                          MDM:  Pt given percocet and zofran.   Pt has wbcs and rbc in urine.  Ct renal  No stone,   Final Clinical Impression(s) / ED Diagnoses Final diagnoses:  Urinary tract infection with hematuria, site unspecified    Rx / DC Orders ED Discharge Orders         Ordered    cefdinir (OMNICEF) 300 MG capsule  2 times daily        05/03/20 2340        An After Visit Summary was printed and given to the patient.    05/05/20 05/03/20 2342    [id number], MD 05/04/20 [phone]

## 2020-05-03 NOTE — Discharge Instructions (Addendum)
Return if any problems.

## 2020-05-05 ENCOUNTER — Other Ambulatory Visit: Payer: Self-pay

## 2020-05-05 ENCOUNTER — Telehealth: Payer: Self-pay

## 2020-05-05 ENCOUNTER — Ambulatory Visit (INDEPENDENT_AMBULATORY_CARE_PROVIDER_SITE_OTHER): Payer: No Typology Code available for payment source | Admitting: Family Medicine

## 2020-05-05 ENCOUNTER — Encounter: Payer: Self-pay | Admitting: Family Medicine

## 2020-05-05 VITALS — BP 110/62 | HR 86 | Temp 98.7°F | Ht 65.0 in | Wt 183.5 lb

## 2020-05-05 DIAGNOSIS — E876 Hypokalemia: Secondary | ICD-10-CM | POA: Insufficient documentation

## 2020-05-05 DIAGNOSIS — N12 Tubulo-interstitial nephritis, not specified as acute or chronic: Secondary | ICD-10-CM | POA: Diagnosis not present

## 2020-05-05 MED ORDER — TOPIRAMATE 200 MG PO TABS: 200.0000 mg | ORAL_TABLET | Freq: Every day | ORAL | 3 refills | Status: DC

## 2020-05-05 MED ORDER — SERTRALINE HCL 100 MG PO TABS: 100.0000 mg | ORAL_TABLET | Freq: Every day | ORAL | 3 refills | Status: DC

## 2020-05-05 MED ORDER — PROMETHAZINE HCL 12.5 MG PO TABS: 12.5000 mg | ORAL_TABLET | Freq: Four times a day (QID) | ORAL | 0 refills | Status: DC | PRN

## 2020-05-05 NOTE — Telephone Encounter (Signed)
Refills sent as requested

## 2020-05-05 NOTE — Patient Instructions (Addendum)
Push fluids.  Use tylenol 500 mg 2 tabs every  6 hours for pain if needed.  Complete antibiotics.  Can use phenergan as needed for nausea.  Call  Or go to ER if severe pain or not keeping down antibiotics.

## 2020-05-05 NOTE — Assessment & Plan Note (Signed)
Acute, S/P repletion... likely due to emesis and poor intake.   Can consider recheck after acute illness resolved.

## 2020-05-05 NOTE — Progress Notes (Signed)
Patient ID: Julie Mckenzie, female    DOB: Dec 13, 1995, 25 y.o.   MRN: [id number]  This visit was conducted in person.  BP 110/62   Pulse 86   Temp 98.7 F (37.1 C) (Oral)   Ht 5\' 5"  (1.651 m)   Wt 183 lb 8 oz (83.2 kg)   SpO2 99%   BMI 30.54 kg/m    CC:  Chief Complaint  Patient presents with  . Follow-up    [facility] ED 05/03/2020 Dx: UTI    Subjective:   HPI: Julie Mckenzie is a 25 y.o. female presenting on 05/05/2020 for Follow-up [facility] ED 05/03/2020 Dx: UTI)  Treated at [facility] ED for UTI on 05/03/20.( reviewed note in detail).. for right CVA pain 10/10 pain and blood in urine CT renal : no stone. Treated with cefdinir 300 mg BID  X 7 days. Urine culture showed:  >100,000 gram neg rods.. multiple specieis.   She now reports less pain in right CVA. Pain now 3/10.  No fever. Cannot take percocet. Having some nausea.. zofran  Made things worse. Decreased po intake but  Keeping up fluids... has had 24 ox so far today. Nausea improved.  Using azo.. so no dysuria. No further blood in urine.     Relevant past medical, surgical, family and social history reviewed and updated as indicated. Interim medical history since our last visit reviewed. Allergies and medications reviewed and updated. Outpatient Medications Prior to Visit  Medication Sig Dispense Refill  . cefdinir (OMNICEF) 300 MG capsule Take 1 capsule (300 mg total) by mouth 2 (two) times daily. 14 capsule 0  . HYDROcodone-acetaminophen (NORCO/VICODIN) 5-325 MG tablet Take 1 tablet by mouth every 4 (four) hours as needed for up to 3 days for moderate pain. 10 tablet 0  . levonorgestrel (MIRENA) 20 MCG/24HR IUD 1 each by Intrauterine route once.    . sertraline (ZOLOFT) 100 MG tablet Take 1 tablet (100 mg total) by mouth daily. 30 tablet 3  . SUMAtriptan (IMITREX) 100 MG tablet TAKE 1 TABLET (100 MG TOTAL) BY MOUTH EVERY 2 (TWO) HOURS AS NEEDED FOR MIGRAINE. MAY REPEAT IN 2 HOURS IF HEADACHE PERSISTS OR RECURS. 10  tablet 5  . topiramate (TOPAMAX) 200 MG tablet Take 1 tablet (200 mg total) by mouth at bedtime. 30 tablet 3  . scopolamine (TRANSDERM-SCOP, 1.5 MG,) 1 MG/3DAYS Place 1 patch (1.5 mg total) onto the skin every 3 (three) days. 4 patch 0   No facility-administered medications prior to visit.     Per HPI unless specifically indicated in ROS section below Review of Systems  Constitutional: Negative for fatigue and fever.  HENT: Negative for congestion.   Eyes: Negative for pain.  Respiratory: Negative for cough and shortness of breath.   Cardiovascular: Negative for chest pain, palpitations and leg swelling.  Gastrointestinal: Negative for abdominal pain.  Genitourinary: Negative for dysuria and vaginal bleeding.  Musculoskeletal: Negative for back pain.  Neurological: Negative for syncope, light-headedness and headaches.  Psychiatric/Behavioral: Negative for dysphoric mood.   Objective:  BP 110/62   Pulse 86   Temp 98.7 F (37.1 C) (Oral)   Ht 5\' 5"  (1.651 m)   Wt 183 lb 8 oz (83.2 kg)   SpO2 99%   BMI 30.54 kg/m   Wt Readings from Last 3 Encounters:  05/05/20 183 lb 8 oz (83.2 kg)  05/03/20 165 lb (74.8 kg)  11/11/19 166 lb 8 oz (75.5 kg)      Physical Exam Constitutional:  General: She is not in acute distress.Vital signs are normal.     Appearance: Normal appearance. She is well-developed and well-nourished. She is not ill-appearing or toxic-appearing.  HENT:     Head: Normocephalic.     Right Ear: Hearing, tympanic membrane, ear canal and external ear normal. Tympanic membrane is not erythematous, retracted or bulging.     Left Ear: Hearing, tympanic membrane, ear canal and external ear normal. Tympanic membrane is not erythematous, retracted or bulging.     Nose: No mucosal edema or rhinorrhea.     Right Sinus: No maxillary sinus tenderness or frontal sinus tenderness.     Left Sinus: No maxillary sinus tenderness or frontal sinus tenderness.     Mouth/Throat:      Mouth: Oropharynx is clear and moist and mucous membranes are normal.     Pharynx: Uvula midline.  Eyes:     General: Lids are normal. Lids are everted, no foreign bodies appreciated.     Extraocular Movements: EOM normal.     Conjunctiva/sclera: Conjunctivae normal.     Pupils: Pupils are equal, round, and reactive to light.  Neck:     Thyroid: No thyroid mass or thyromegaly.     Vascular: No carotid bruit.     Trachea: Trachea normal.  Cardiovascular:     Rate and Rhythm: Normal rate and regular rhythm.     Pulses: Normal pulses and intact distal pulses.     Heart sounds: Normal heart sounds, S1 normal and S2 normal. No murmur heard. No friction rub. No gallop.   Pulmonary:     Effort: Pulmonary effort is normal. No tachypnea or respiratory distress.     Breath sounds: Normal breath sounds. No decreased breath sounds, wheezing, rhonchi or rales.  Abdominal:     General: Bowel sounds are normal.     Palpations: Abdomen is soft.     Tenderness: There is no abdominal tenderness. There is right CVA tenderness. There is no left CVA tenderness.  Musculoskeletal:     Cervical back: Normal range of motion and neck supple.  Skin:    General: Skin is warm, dry and intact.     Findings: No rash.  Neurological:     Mental Status: She is alert.  Psychiatric:        Mood and Affect: Mood is not anxious or depressed.        Speech: Speech normal.        Behavior: Behavior normal. Behavior is cooperative.        Thought Content: Thought content normal.        Cognition and Memory: Cognition and memory normal.        Judgment: Judgment normal.       Results for orders placed or performed during the hospital encounter of 05/03/20  Urine Culture   Specimen: Urine, Clean Catch  Result Value Ref Range   Specimen Description      URINE, CLEAN CATCH Performed at [facility], [address] [place], [place] [address]    Special Requests      NONE Performed at [facility], [address], [place], [place] [address]    Culture (A)     >=100,000 COLONIES/mL GRAM NEGATIVE RODS CULTURE REINCUBATED FOR BETTER GROWTH SUSCEPTIBILITIES TO FOLLOW Performed at [facility], [address], [place], [place] [address]    Report Status PENDING   Urinalysis, Routine w reflex microscopic Urine, Clean Catch  Result Value Ref  Range   Color, Urine AMBER (A) YELLOW   APPearance HAZY (A) CLEAR   Specific Gravity, Urine 1.005 1.005 - 1.030   pH 5.0 5.0 - 8.0   Glucose, UA NEGATIVE NEGATIVE mg/dL   Hgb urine dipstick LARGE (A) NEGATIVE   Bilirubin Urine NEGATIVE NEGATIVE   Ketones, ur NEGATIVE NEGATIVE mg/dL   Protein, ur 032 (A) NEGATIVE mg/dL   Nitrite POSITIVE (A) NEGATIVE   Leukocytes,Ua MODERATE (A) NEGATIVE   RBC / HPF 21-50 0 - 5 RBC/hpf   WBC, UA >50 (H) 0 - 5 WBC/hpf   Bacteria, UA FEW (A) NONE SEEN   Squamous Epithelial / LPF 0-5 0 - 5   WBC Clumps PRESENT    Mucus PRESENT   Basic metabolic panel  Result Value Ref Range   Sodium 139 135 - 145 mmol/L   Potassium 3.0 (L) 3.5 - 5.1 mmol/L   Chloride 106 98 - 111 mmol/L   CO2 23 22 - 32 mmol/L   Glucose, Bld 99 70 - 99 mg/dL   BUN 7 6 - 20 mg/dL   Creatinine, Ser 1.22 0.44 - 1.00 mg/dL   Calcium 9.1 8.9 - 48.2 mg/dL   GFR, Estimated >50 >03 mL/min   Anion gap 10 5 - 15  CBC  Result Value Ref Range   WBC 9.4 4.0 - 10.5 K/uL   RBC 4.79 3.87 - 5.11 MIL/uL   Hemoglobin 14.4 12.0 - 15.0 g/dL   HCT 70.4 88.8 - 91.6 %   MCV 91.6 80.0 - 100.0 fL   MCH 30.1 26.0 - 34.0 pg   MCHC 32.8 30.0 - 36.0 g/dL   RDW 94.5 03.8 - 88.2 %   Platelets 215 150 - 400 K/uL   nRBC 0.0 0.0 - 0.2 %  I-Stat beta hCG blood, ED  Result Value Ref Range   I-stat hCG, quantitative <5.0 <5 mIU/mL   Comment 3            This visit occurred during the SARS-CoV-2 public health emergency.  Safety protocols were in place, including screening questions prior to the visit, additional usage of staff PPE,  and extensive cleaning of exam room while observing appropriate contact time as indicated for disinfecting solutions.   COVID 19 screen:  No recent travel or known exposure to COVID19 The patient denies respiratory symptoms of COVID 19 at this time. The importance of social distancing was discussed today.   Assessment and Plan    Problem List Items Addressed This Visit    Hypokalemia      Acute, S/P repletion... likely due to emesis and poor intake.   Can consider recheck after acute illness resolved.      Pyelonephritis - Primary     Acute  Improving on cefdinir oral. Tolerating.  Can use phenergan for nausea and tylenol for pain.  Return and ER precautions given.          Kerby Nora, MD

## 2020-05-05 NOTE — Telephone Encounter (Addendum)
Pt left a message on triage line asking for refills of sertraline and Topamax. She did not leave the pharmacy. I have called her back asking her for that information.  Called back. [facility] [address].

## 2020-05-05 NOTE — Assessment & Plan Note (Signed)
Acute  Improving on cefdinir oral. Tolerating.  Can use phenergan for nausea and tylenol for pain.  Return and ER precautions given.

## 2020-05-06 LAB — URINE CULTURE: Culture: >=100000 — AB

## 2020-05-07 ENCOUNTER — Telehealth: Payer: Self-pay | Admitting: Emergency Medicine

## 2020-05-07 NOTE — Telephone Encounter (Signed)
Post ED Visit - Positive Culture Follow-up  Culture report reviewed by antimicrobial stewardship pharmacist: [facility] []  , Pharm.D. []  Enzo Bi, Pharm.D., BCPS AQ-ID []  , Pharm.D., BCPS []  Celedonio Miyamoto, Pharm.D., BCPS []  [place], Garvin Fila.D., BCPS, AAHIVP []  , Pharm.D., BCPS, AAHIVP []  Georgina Pillion, PharmD, BCPS []  , PharmD, BCPS []  [place], PharmD, BCPS []  [address], PharmD []  , PharmD, BCPS []  Estella Husk, PharmD  Pharmacy Team []  Lysle Pearl, PharmD []  , PharmD []  Phillips Climes, PharmD []  , Rph []  Agapito Games) , PharmD []  Verlan Friends, PharmD []  , PharmD []  Mervyn Gay, PharmD []  , PharmD []  Vinnie [place], PharmD [x]  [facility], PharmD []  , PharmD []  Len Childs, PharmD   Positive urine culture Treated with Cefdinir, organism sensitive to the same and no further patient follow-up is required at this time.  Zeva Leber 05/07/2020, 5:07 PM

## 2020-05-09 ENCOUNTER — Encounter: Payer: Self-pay | Admitting: Family Medicine

## 2020-08-31 ENCOUNTER — Other Ambulatory Visit: Payer: Self-pay | Admitting: Family Medicine

## 2020-08-31 NOTE — Telephone Encounter (Signed)
Left voice message to call the office  

## 2020-08-31 NOTE — Telephone Encounter (Signed)
Please schedule CPE with Dr. Ermalene Searing.  Once scheduled send back to me to refill medication.

## 2020-09-05 NOTE — Telephone Encounter (Signed)
Left voice message to call the office  

## 2020-09-12 NOTE — Telephone Encounter (Signed)
Left voice message to call the office . Letter sent  

## 2020-09-19 ENCOUNTER — Ambulatory Visit: Payer: BC Managed Care – PPO | Admitting: Family Medicine

## 2020-09-19 ENCOUNTER — Other Ambulatory Visit: Payer: Self-pay

## 2020-09-19 ENCOUNTER — Encounter: Payer: Self-pay | Admitting: Family Medicine

## 2020-09-19 VITALS — BP 130/72 | HR 95 | Temp 98.5°F | Ht 65.0 in | Wt 192.0 lb

## 2020-09-19 DIAGNOSIS — N3001 Acute cystitis with hematuria: Secondary | ICD-10-CM

## 2020-09-19 DIAGNOSIS — K529 Noninfective gastroenteritis and colitis, unspecified: Secondary | ICD-10-CM | POA: Diagnosis not present

## 2020-09-19 NOTE — Progress Notes (Signed)
Patient ID: XOCHILT CONANT, female    DOB: Dec 07, 1995, 25 y.o.   MRN: [id number]  This visit was conducted in person.  BP 130/72   Pulse 95   Temp 98.5 F (36.9 C) (Temporal)   Ht 5\' 5"  (1.651 m)   Wt 192 lb (87.1 kg)   SpO2 97%   BMI 31.95 kg/m    CC:  Chief Complaint  Patient presents with   Follow-up    Fast [facility] visit 09/15/2020    Subjective:   HPI: GENIE WENKE is a 25 y.o. female presenting on 09/19/2020 for Follow-up ([facility] visit 09/15/2020)   Reviewed OV note from 6/10.11/15/2020 Fast MEd: Dx with  UTI Was also exposed to GI illness at [facility].  Given Zofran and treated with  nitrofurantoin for 5 days UA positive for trace blood, nitrite and 2 plus LE  Urine culture:  Neg COVID test at work   STI testing:  chlamydia  U preg negative  She has had improvement with urinary symptoms... still some intermittent urgency/burning mild. No fever, complete resolution of emesis.  No flank pain.  Eating like normal.   Relevant past medical, surgical, family and social history reviewed and updated as indicated. Interim medical history since our last visit reviewed. Allergies and medications reviewed and updated. Outpatient Medications Prior to Visit  Medication Sig Dispense Refill   levonorgestrel (MIRENA) 20 MCG/24HR IUD 1 each by Intrauterine route once.     nitrofurantoin, macrocrystal-monohydrate, (MACROBID) 100 MG capsule Take 100 mg by mouth 2 (two) times daily.     ondansetron (ZOFRAN) 4 MG tablet Take 4 mg by mouth every 8 (eight) hours as needed.     sertraline (ZOLOFT) 100 MG tablet Take 1 tablet (100 mg total) by mouth daily. 30 tablet 3   SUMAtriptan (IMITREX) 100 MG tablet TAKE 1 TABLET (100 MG TOTAL) BY MOUTH EVERY 2 (TWO) HOURS AS NEEDED FOR MIGRAINE. MAY REPEAT IN 2 HOURS IF HEADACHE PERSISTS OR RECURS. 10 tablet 5   topiramate (TOPAMAX) 200 MG tablet Take 1 tablet (200 mg total) by mouth at bedtime. 30 tablet 3   cefdinir (OMNICEF) 300 MG capsule Take 1  capsule (300 mg total) by mouth 2 (two) times daily. 14 capsule 0   promethazine (PHENERGAN) 12.5 MG tablet Take 1-2 tablets (12.5-25 mg total) by mouth every 6 (six) hours as needed for nausea or vomiting. 15 tablet 0   No facility-administered medications prior to visit.     Per HPI unless specifically indicated in ROS section below Review of Systems  Constitutional:  Negative for fatigue and fever.  HENT:  Negative for congestion.   Eyes:  Negative for pain.  Respiratory:  Negative for cough and shortness of breath.   Cardiovascular:  Negative for chest pain, palpitations and leg swelling.  Gastrointestinal:  Negative for abdominal pain.  Genitourinary:  Negative for dysuria and vaginal bleeding.  Musculoskeletal:  Negative for back pain.  Neurological:  Negative for syncope, light-headedness and headaches.  Psychiatric/Behavioral:  Negative for dysphoric mood.   Objective:  BP 130/72   Pulse 95   Temp 98.5 F (36.9 C) (Temporal)   Ht 5\' 5"  (1.651 m)   Wt 192 lb (87.1 kg)   SpO2 97%   BMI 31.95 kg/m   Wt Readings from Last 3 Encounters:  09/19/20 192 lb (87.1 kg)  05/05/20 183 lb 8 oz (83.2 kg)  05/03/20 165 lb (74.8 kg)      Physical Exam Constitutional:  General: She is not in acute distress.    Appearance: Normal appearance. She is well-developed. She is not ill-appearing or toxic-appearing.  HENT:     Head: Normocephalic.     Right Ear: Hearing, tympanic membrane, ear canal and external ear normal. Tympanic membrane is not erythematous, retracted or bulging.     Left Ear: Hearing, tympanic membrane, ear canal and external ear normal. Tympanic membrane is not erythematous, retracted or bulging.     Nose: No mucosal edema or rhinorrhea.     Right Sinus: No maxillary sinus tenderness or frontal sinus tenderness.     Left Sinus: No maxillary sinus tenderness or frontal sinus tenderness.     Mouth/Throat:     Pharynx: Uvula midline.  Eyes:     General: Lids are  normal. Lids are everted, no foreign bodies appreciated.     Conjunctiva/sclera: Conjunctivae normal.     Pupils: Pupils are equal, round, and reactive to light.  Neck:     Thyroid: No thyroid mass or thyromegaly.     Vascular: No carotid bruit.     Trachea: Trachea normal.  Cardiovascular:     Rate and Rhythm: Normal rate and regular rhythm.     Pulses: Normal pulses.     Heart sounds: Normal heart sounds, S1 normal and S2 normal. No murmur heard.   No friction rub. No gallop.  Pulmonary:     Effort: Pulmonary effort is normal. No tachypnea or respiratory distress.     Breath sounds: Normal breath sounds. No decreased breath sounds, wheezing, rhonchi or rales.  Abdominal:     General: Bowel sounds are normal.     Palpations: Abdomen is soft.     Tenderness: There is no abdominal tenderness.  Musculoskeletal:     Cervical back: Normal range of motion and neck supple.  Skin:    General: Skin is warm and dry.     Findings: No rash.  Neurological:     Mental Status: She is alert.  Psychiatric:        Mood and Affect: Mood is not anxious or depressed.        Speech: Speech normal.        Behavior: Behavior normal. Behavior is cooperative.        Thought Content: Thought content normal.        Judgment: Judgment normal.      Results for orders placed or performed in visit on 05/09/20  HM PAP SMEAR  Result Value Ref Range   HM Pap smear ASCUS/Negative HPV     This visit occurred during the SARS-CoV-2 public health emergency.  Safety protocols were in place, including screening questions prior to the visit, additional usage of staff PPE, and extensive cleaning of exam room while observing appropriate contact time as indicated for disinfecting solutions.   COVID 19 screen:  No recent travel or known exposure to COVID19 The patient denies respiratory symptoms of COVID 19 at this time. The importance of social distancing was discussed today.   Assessment and Plan Problem List  Items Addressed This Visit   None Visit Diagnoses     Gastroenteritis    -  Primary   Acute cystitis with hematuria         Reviewed Care Everywhere and Fast Med Notes and labs.Could not find all results. Requested urine culture result. Symptoms of UTI improving and almost resolved on day 4/5 of nitrofurantoin. Resolved gastroenteritis.. rest, fluids.    Kerby Nora, MD

## 2020-09-19 NOTE — Patient Instructions (Signed)
Push fluids. We will request  the urine culture and chlamydia results.  Complete antibiotics and call if not having resolution of symptoms.

## 2020-09-26 NOTE — Telephone Encounter (Signed)
Spoke with patient scheduled CPE with fasting labs 

## 2020-09-26 NOTE — Telephone Encounter (Signed)
Refill called into [facility] in [place].

## 2020-10-11 ENCOUNTER — Other Ambulatory Visit: Payer: Self-pay | Admitting: Family Medicine

## 2020-11-05 ENCOUNTER — Telehealth: Payer: Self-pay | Admitting: Family Medicine

## 2020-11-05 DIAGNOSIS — Z131 Encounter for screening for diabetes mellitus: Secondary | ICD-10-CM

## 2020-11-05 DIAGNOSIS — Z13220 Encounter for screening for lipoid disorders: Secondary | ICD-10-CM

## 2020-11-05 NOTE — Telephone Encounter (Signed)
-----   Message from Aquilla Solian, RT sent at 10/23/2020 10:05 AM EDT ----- Regarding: Lab Orders for Tuesday 8.2.2022 Please place lab orders for Tuesday 8.2.2022, office visit for physical on Tuesday 8.9.2022 Thank you, Jones Bales RT(R)

## 2020-11-07 ENCOUNTER — Other Ambulatory Visit: Payer: Self-pay

## 2020-11-07 ENCOUNTER — Other Ambulatory Visit (INDEPENDENT_AMBULATORY_CARE_PROVIDER_SITE_OTHER): Payer: BC Managed Care – PPO

## 2020-11-07 DIAGNOSIS — Z131 Encounter for screening for diabetes mellitus: Secondary | ICD-10-CM

## 2020-11-07 DIAGNOSIS — Z13220 Encounter for screening for lipoid disorders: Secondary | ICD-10-CM

## 2020-11-07 LAB — LIPID PANEL
Cholesterol: 151 mg/dL (ref 0–200)
HDL: 51.9 mg/dL (ref >39.00)
LDL Cholesterol: 88 mg/dL (ref 0–99)
NonHDL: 98.81
Total CHOL/HDL Ratio: 3
Triglycerides: 54 mg/dL (ref 0.0–149.0)
VLDL: 10.8 mg/dL (ref 0.0–40.0)

## 2020-11-07 LAB — COMPREHENSIVE METABOLIC PANEL
ALT: 17 U/L (ref 0–35)
AST: 15 U/L (ref 0–37)
Albumin: 4.4 g/dL (ref 3.5–5.2)
Alkaline Phosphatase: 51 U/L (ref 39–117)
BUN: 8 mg/dL (ref 6–23)
CO2: 26 mEq/L (ref 19–32)
Calcium: 9.3 mg/dL (ref 8.4–10.5)
Chloride: 103 mEq/L (ref 96–112)
Creatinine, Ser: 0.68 mg/dL (ref 0.40–1.20)
GFR: 120.14 mL/min (ref >60.00)
Glucose, Bld: 90 mg/dL (ref 70–99)
Potassium: 4.2 mEq/L (ref 3.5–5.1)
Sodium: 138 mEq/L (ref 135–145)
Total Bilirubin: 0.8 mg/dL (ref 0.2–1.2)
Total Protein: 7.1 g/dL (ref 6.0–8.3)

## 2020-11-07 LAB — HEMOGLOBIN A1C: Hgb A1c MFr Bld: 4.6 % (ref 4.6–6.5)

## 2020-11-09 NOTE — Progress Notes (Signed)
No critical labs need to be addressed urgently. We will discuss labs in detail at upcoming office visit.   

## 2020-11-14 ENCOUNTER — Other Ambulatory Visit: Payer: Self-pay

## 2020-11-14 ENCOUNTER — Ambulatory Visit (INDEPENDENT_AMBULATORY_CARE_PROVIDER_SITE_OTHER): Payer: BC Managed Care – PPO | Admitting: Family Medicine

## 2020-11-14 ENCOUNTER — Encounter: Payer: Self-pay | Admitting: Family Medicine

## 2020-11-14 VITALS — BP 120/76 | HR 97 | Temp 98.5°F | Ht 65.0 in | Wt 195.0 lb

## 2020-11-14 DIAGNOSIS — G43009 Migraine without aura, not intractable, without status migrainosus: Secondary | ICD-10-CM

## 2020-11-14 DIAGNOSIS — F411 Generalized anxiety disorder: Secondary | ICD-10-CM

## 2020-11-14 DIAGNOSIS — Z0000 Encounter for general adult medical examination without abnormal findings: Secondary | ICD-10-CM

## 2020-11-14 DIAGNOSIS — Z6832 Body mass index (BMI) 32.0-32.9, adult: Secondary | ICD-10-CM | POA: Insufficient documentation

## 2020-11-14 MED ORDER — TOPIRAMATE 200 MG PO TABS: 200.0000 mg | ORAL_TABLET | Freq: Every day | ORAL | 5 refills | Status: DC

## 2020-11-14 MED ORDER — SUMATRIPTAN SUCCINATE 100 MG PO TABS
100.0000 mg | ORAL_TABLET | ORAL | 5 refills | Status: DC | PRN
Start: 2020-11-14 — End: 2021-01-10

## 2020-11-14 MED ORDER — SERTRALINE HCL 100 MG PO TABS: 100.0000 mg | ORAL_TABLET | Freq: Every day | ORAL | 1 refills | Status: DC

## 2020-11-14 NOTE — Assessment & Plan Note (Signed)
Can try trial of imitrex prior to large storms to see if can avoid more severe migraines.  Continue topiramate but will refer to Neurology for possible use of newer preventative medications.

## 2020-11-14 NOTE — Assessment & Plan Note (Signed)
Encouraged exercise, weight loss, healthy eating habits. ? ?

## 2020-11-14 NOTE — Assessment & Plan Note (Signed)
Stable, chronic.  Continue current medication.   sertraline 100 mg daily.

## 2020-11-14 NOTE — Patient Instructions (Signed)
We will work on setting up neurology referral.
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
## 2020-11-29 ENCOUNTER — Telehealth: Payer: Self-pay | Admitting: Family Medicine

## 2020-11-29 DIAGNOSIS — Z800 Family history of malignant neoplasm of digestive organs: Secondary | ICD-10-CM

## 2020-11-29 NOTE — Telephone Encounter (Signed)
Pt wanted to have a referral placed to have a colonoscopy done. Her farther just got diagnosed with rectal cancer

## 2020-11-30 NOTE — Telephone Encounter (Signed)
Location/MD preference?

## 2020-11-30 NOTE — Telephone Encounter (Signed)
Left message for Julie Mckenzie to call back with location preference and MD preference.

## 2020-12-01 NOTE — Addendum Note (Signed)
Addended by: Kerby Nora E on: 12/01/2020 05:29 PM   Modules accepted: Orders

## 2020-12-01 NOTE — Telephone Encounter (Signed)
Left message for Julie Mckenzie to call back or send MyChart message,  with location preference and MD preference.

## 2020-12-01 NOTE — Telephone Encounter (Signed)
Patient called back and states she does not have a preference on location whichever could do soonest and Dr [name] preference. Patient is aware that GI office will call her directly to schedule this.

## 2020-12-14 ENCOUNTER — Other Ambulatory Visit: Payer: Self-pay | Admitting: Family Medicine
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
## 2021-01-10 ENCOUNTER — Encounter: Payer: Self-pay | Admitting: Psychiatry

## 2021-01-10 ENCOUNTER — Ambulatory Visit: Payer: BC Managed Care – PPO | Admitting: Psychiatry

## 2021-01-10 VITALS — BP 132/80 | HR 72 | Ht 66.0 in | Wt 197.8 lb

## 2021-01-10 DIAGNOSIS — G43009 Migraine without aura, not intractable, without status migrainosus: Secondary | ICD-10-CM

## 2021-01-10 MED ORDER — RIZATRIPTAN BENZOATE 10 MG PO TABS: 10.0000 mg | ORAL_TABLET | ORAL | 2 refills | Status: DC | PRN

## 2021-01-10 MED ORDER — TOPIRAMATE 25 MG PO TABS: ORAL_TABLET | ORAL | 0 refills | Status: DC

## 2021-01-10 MED ORDER — TOPIRAMATE 100 MG PO TABS: ORAL_TABLET | ORAL | 3 refills | Status: DC

## 2021-01-10 NOTE — Patient Instructions (Addendum)
Decrease Topamax. Take 175 mg at bedtime for one week, then 150 mg at bedtime for one week, then 125 mg at bedtime for one week. Then take 100 mg at bedtime. Start Maxalt 10 mg PRN. Take at the onset of migraine. If headache recurs or does not fully resolve, you may take a second dose after 2 hours. Please avoid taking more than 2 days per week or 10 days per month. Consider physical therapy for the neck

## 2021-01-19 ENCOUNTER — Ambulatory Visit: Payer: BC Managed Care – PPO | Admitting: Psychiatry

## 2021-01-21 ENCOUNTER — Other Ambulatory Visit: Payer: Self-pay | Admitting: Family Medicine

## 2021-02-26 ENCOUNTER — Ambulatory Visit: Payer: Self-pay

## 2021-04-18 ENCOUNTER — Encounter: Payer: Self-pay | Admitting: Psychiatry

## 2021-04-18 ENCOUNTER — Ambulatory Visit: Payer: BC Managed Care – PPO | Admitting: Psychiatry

## 2021-08-22 ENCOUNTER — Encounter: Payer: Self-pay | Admitting: Family Medicine

## 2021-08-22 ENCOUNTER — Other Ambulatory Visit: Payer: Self-pay | Admitting: Family Medicine

## 2021-08-22 DIAGNOSIS — G43009 Migraine without aura, not intractable, without status migrainosus: Secondary | ICD-10-CM

## 2021-08-31 ENCOUNTER — Ambulatory Visit (INDEPENDENT_AMBULATORY_CARE_PROVIDER_SITE_OTHER): Payer: BC Managed Care – PPO | Admitting: Family Medicine

## 2021-08-31 ENCOUNTER — Other Ambulatory Visit: Payer: Self-pay

## 2021-08-31 ENCOUNTER — Encounter: Payer: Self-pay | Admitting: Family Medicine

## 2021-08-31 VITALS — BP 130/84 | HR 115 | Temp 98.6°F | Ht 65.0 in | Wt 212.5 lb

## 2021-08-31 DIAGNOSIS — F331 Major depressive disorder, recurrent, moderate: Secondary | ICD-10-CM | POA: Diagnosis not present

## 2021-08-31 DIAGNOSIS — Z23 Encounter for immunization: Secondary | ICD-10-CM

## 2021-08-31 DIAGNOSIS — Z020 Encounter for examination for admission to educational institution: Secondary | ICD-10-CM

## 2021-08-31 DIAGNOSIS — F411 Generalized anxiety disorder: Secondary | ICD-10-CM

## 2021-08-31 MED ORDER — SERTRALINE HCL 100 MG PO TABS
100.0000 mg | ORAL_TABLET | Freq: Every day | ORAL | 0 refills | Status: DC
Start: 2021-08-31 — End: 2021-11-28

## 2021-08-31 MED ORDER — BUPROPION HCL ER (XL) 150 MG PO TB24: 150.0000 mg | ORAL_TABLET | Freq: Every day | ORAL | 5 refills | Status: DC

## 2021-08-31 NOTE — Assessment & Plan Note (Addendum)
Chronic, recurrent, poor control  She has had significant benefit from sertraline 100 mg p.o. daily.  She still has further room for improvement and recent worsening of mood with increase in stress from family issues and loss. Options and have move forward with a referral to psychology for counseling as well as the addition of a depression adjunct: Wellbutrin XL 150 mg p.o. daily.  She will continue the sertraline 100 mg p.o. daily.

## 2021-08-31 NOTE — Assessment & Plan Note (Signed)
Chronic, inadequate control. ?

## 2021-08-31 NOTE — Assessment & Plan Note (Signed)
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 Per the patient in the past she has had varicella titers that showed immunity. 06/2020 QuantiFERON negative

## 2021-08-31 NOTE — Progress Notes (Signed)
Patient ID: Julie Mckenzie, female    DOB: [birth year], 25 y.o.   MRN: [id number]  This visit was conducted in person.  BP 130/84   Pulse (!) 115   Temp 98.6 F (37 C) (Oral)   Ht 5\' 5"  (1.651 m)   Wt 212 lb 8 oz (96.4 kg)   SpO2 98%   BMI 35.36 kg/m    CC:  Chief Complaint  Patient presents with   Depression   Panic Attack    Subjective:   HPI: Julie Mckenzie is a 26 y.o. female presenting on 08/31/2021 for Depression and Panic Attack  GAD, panic attacks and MDD:  Well-controlled on sertraline 100 mg p.o. daily  PHQ9:  12 GAD: 7  She has had a lot  of stressor... father with cancer. MGF passed, dog died etc.  Falls asleep well.  She is feeling very tired, wakes up with panic attacks.  Able to fall back asleep.  Working night shifts.   She also needs completion of forms for her program in school.  She is due for second meningitis vaccine. She had Quantiferon test last year.  She has no physical limitations or chronic pain.      Relevant past medical, surgical, family and social history reviewed and updated as indicated. Interim medical history since our last visit reviewed. Allergies and medications reviewed and updated. Outpatient Medications Prior to Visit  Medication Sig Dispense Refill   levonorgestrel (MIRENA) 20 MCG/24HR IUD 1 each by Intrauterine route once.     rizatriptan (MAXALT) 10 MG tablet Take 1 tablet (10 mg total) by mouth as needed for migraine. May repeat in 2 hours if needed 10 tablet 2   sertraline (ZOLOFT) 100 MG tablet Take 1 tablet (100 mg total) by mouth daily. 90 tablet 1   topiramate (TOPAMAX) 100 MG tablet Take with 25 mg tablets. Take 175 mg at bedtime for one week, then 150 mg at bedtime for one week, then 125 mg at bedtime for one week. Then take 100 mg at bedtime. 45 tablet 3   topiramate (TOPAMAX) 25 MG tablet Take with 100 mg tablets. Take 175 mg (3 pills + 100 mg tablet) for one week, then 150 mg (2 pills +100 mg tablet) for  one week, then 125 (1 pill +100 mg tablet) for one week. Then take 100 mg at bedtime 21 tablet 0   No facility-administered medications prior to visit.     Per HPI unless specifically indicated in ROS section below Review of Systems  Constitutional:  Negative for fatigue and fever.  HENT:  Negative for congestion.   Eyes:  Negative for pain.  Respiratory:  Negative for cough and shortness of breath.   Cardiovascular:  Negative for chest pain, palpitations and leg swelling.  Gastrointestinal:  Negative for abdominal pain.  Genitourinary:  Negative for dysuria and vaginal bleeding.  Musculoskeletal:  Negative for back pain.  Neurological:  Negative for syncope, light-headedness and headaches.  Psychiatric/Behavioral:  Negative for dysphoric mood.   Objective:  BP 130/84   Pulse (!) 115   Temp 98.6 F (37 C) (Oral)   Ht 5\' 5"  (1.651 m)   Wt 212 lb 8 oz (96.4 kg)   SpO2 98%   BMI 35.36 kg/m   Wt Readings from Last 3 Encounters:  08/31/21 212 lb 8 oz (96.4 kg)  01/10/21 197 lb 12.8 oz (89.7 kg)  11/14/20 195 lb (88.5 kg)      Physical Exam Vitals and  nursing note reviewed.  Constitutional:      General: She is not in acute distress.    Appearance: Normal appearance. She is well-developed. She is not ill-appearing or toxic-appearing.  HENT:     Head: Normocephalic.     Right Ear: Hearing, tympanic membrane, ear canal and external ear normal. Tympanic membrane is not erythematous, retracted or bulging.     Left Ear: Hearing, tympanic membrane, ear canal and external ear normal. Tympanic membrane is not erythematous, retracted or bulging.     Nose: Nose normal. No mucosal edema or rhinorrhea.     Right Sinus: No maxillary sinus tenderness or frontal sinus tenderness.     Left Sinus: No maxillary sinus tenderness or frontal sinus tenderness.     Mouth/Throat:     Pharynx: Uvula midline.  Eyes:     General: Lids are normal. Lids are everted, no foreign bodies appreciated.      Conjunctiva/sclera: Conjunctivae normal.     Pupils: Pupils are equal, round, and reactive to light.  Neck:     Thyroid: No thyroid mass or thyromegaly.     Vascular: No carotid bruit.     Trachea: Trachea normal.  Cardiovascular:     Rate and Rhythm: Normal rate and regular rhythm.     Pulses: Normal pulses.     Heart sounds: Normal heart sounds, S1 normal and S2 normal. No murmur heard.   No friction rub. No gallop.  Pulmonary:     Effort: Pulmonary effort is normal. No tachypnea or respiratory distress.     Breath sounds: Normal breath sounds. No decreased breath sounds, wheezing, rhonchi or rales.  Abdominal:     General: Bowel sounds are normal. There is no distension or abdominal bruit.     Palpations: Abdomen is soft. There is no fluid wave or mass.     Tenderness: There is no abdominal tenderness. There is no guarding or rebound.     Hernia: No hernia is present.  Musculoskeletal:     Cervical back: Normal range of motion and neck supple.  Lymphadenopathy:     Cervical: No cervical adenopathy.  Skin:    General: Skin is warm and dry.     Findings: No rash.  Neurological:     Mental Status: She is alert.     Cranial Nerves: No cranial nerve deficit.     Sensory: No sensory deficit.  Psychiatric:        Mood and Affect: Mood is not anxious or depressed.        Speech: Speech normal.        Behavior: Behavior normal. Behavior is cooperative.        Thought Content: Thought content normal.        Judgment: Judgment normal.      Results for orders placed or performed in visit on 11/07/20  Comprehensive metabolic panel  Result Value Ref Range   Sodium 138 135 - 145 mEq/L   Potassium 4.2 3.5 - 5.1 mEq/L   Chloride 103 96 - 112 mEq/L   CO2 26 19 - 32 mEq/L   Glucose, Bld 90 70 - 99 mg/dL   BUN 8 6 - 23 mg/dL   Creatinine, Ser 3.76 0.40 - 1.20 mg/dL   Total Bilirubin 0.8 0.2 - 1.2 mg/dL   Alkaline Phosphatase 51 39 - 117 U/L   AST 15 0 - 37 U/L   ALT 17 0 - 35 U/L    Total Protein 7.1 6.0 - 8.3 g/dL  Albumin 4.4 3.5 - 5.2 g/dL   GFR [id number] >14.78>60.00 mL/min   Calcium 9.3 8.4 - 10.5 mg/dL  Lipid panel  Result Value Ref Range   Cholesterol 151 0 - 200 mg/dL   Triglycerides 29.554.0 0.0 - 149.0 mg/dL   HDL 62.1351.90 >08.65>39.00 mg/dL   VLDL 78.410.8 0.0 - 69.640.0 mg/dL   LDL Cholesterol 88 0 - 99 mg/dL   Total CHOL/HDL Ratio 3    NonHDL 98.81   Hemoglobin A1c  Result Value Ref Range   Hgb A1c MFr Bld 4.6 4.6 - 6.5 %     COVID 19 screen:  No recent travel or known exposure to COVID19 The patient denies respiratory symptoms of COVID 19 at this time. The importance of social distancing was discussed today.   Assessment and Plan    Problem List Items Addressed This Visit     Generalized anxiety disorder    Chronic, inadequate control       Relevant Medications   buPROPion (WELLBUTRIN XL) 150 MG 24 hr tablet   Other Relevant Orders   Ambulatory referral to Psychology   Moderate episode of recurrent major depressive disorder (HCC)    Chronic, recurrent, poor control  She has had significant benefit from sertraline 100 mg p.o. daily.  She still has further room for improvement and recent worsening of mood with increase in stress from family issues and loss. Options and have move forward with a referral to psychology for counseling as well as the addition of a depression adjunct: Wellbutrin XL 150 mg p.o. daily.  She will continue the sertraline 100 mg p.o. daily.       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 Per the patient in the past she has had varicella titers that showed immunity. 06/2020 QuantiFERON negative       Other Visit Diagnoses     Need for meningococcal vaccination        Relevant Orders   Meningococcal MCV4O(Menveo) (Completed)      Meds ordered this encounter  Medications   buPROPion (WELLBUTRIN XL) 150 MG 24 hr tablet    Sig: Take 1 tablet (150 mg total) by mouth daily.    Dispense:  30 tablet    Refill:  5     Kerby NoraAmy Elisea Khader, MD

## 2021-09-22 ENCOUNTER — Other Ambulatory Visit: Payer: Self-pay | Admitting: Family Medicine

## 2021-10-04 ENCOUNTER — Encounter: Payer: Self-pay | Admitting: Family Medicine

## 2021-10-08 ENCOUNTER — Encounter: Payer: Self-pay | Admitting: Family Medicine

## 2021-10-08 ENCOUNTER — Ambulatory Visit: Payer: BC Managed Care – PPO | Admitting: Family Medicine

## 2021-10-08 VITALS — BP 120/82 | HR 80 | Temp 98.5°F | Ht 65.0 in | Wt 211.4 lb

## 2021-10-08 DIAGNOSIS — G43009 Migraine without aura, not intractable, without status migrainosus: Secondary | ICD-10-CM | POA: Diagnosis not present

## 2021-10-08 DIAGNOSIS — Z111 Encounter for screening for respiratory tuberculosis: Secondary | ICD-10-CM

## 2021-10-08 MED ORDER — TOPIRAMATE 50 MG PO TABS: 100.0000 mg | ORAL_TABLET | Freq: Every day | ORAL | 5 refills | Status: DC

## 2021-10-08 MED ORDER — RIZATRIPTAN BENZOATE 10 MG PO TBDP: 10.0000 mg | ORAL_TABLET | ORAL | 0 refills | Status: DC | PRN

## 2021-10-08 NOTE — Progress Notes (Signed)
Patient ID: Julie Mckenzie, female    DOB: 1995/07/01, 26 y.o.   MRN: [id number]  This visit was conducted in person.  Ht 5\' 5"  (1.651 m)   BMI 35.36 kg/m    CC:  Chief Complaint  Patient presents with   Migraine   FMLA Paperwork   Lab work    Quantiferon Gold    Subjective:   HPI: Julie Mckenzie is a 26 y.o. female presenting on 10/08/2021 for Migraine, FMLA Paperwork, and Lab work 12/09/2021 Gold)  She is due for repeat TB testing for her school requirements.  S1 is out of date from March 2022.  Chronic migraines: Ongoing for greater than 9 years.  No aura.  Associated with weather changes.  Significant associated neck pain.  Acute worsening of chronic migraines.  She is struggling with migraines at work and it is interfering with her ability to function.  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 We have attempted to refer her back to the headache clinic at [facility] but they are not excepting patients at this point. Their next available appointment is January 2024.  Old neurology clinic does not accept her insurance. At this point she is interested in restarting Topamax and using Maxalt as needed migraines.  She used this combination in the past and it was moderately helpful.  Reviewed last office visit from [facility] from January 10, 2021 Per this note Topamax 200 mg daily for prevention caused prevention caused irritating paresthesias.  She has been under more stress recently as her father was recently diagnosed with colorectal cancer. At this visit Maxalt 10 mg as needed was tried.  Patient reports this helped better than Imitrex for abortive treatment.  She is also requesting FMLA forms completed for potential absences due to migraines.  She is unable to work with migraine.. unable to do patient care, focus. Severe sensitivity to light. Expected abscenses 4 times a month...  lasting 24-36 hours at sa time.   When she was on the topamax 100 mg.. she was having migraines once weekly. Now migraines are occur 2-3 times a week Maxalt helped more than the  imitrex.. migraine lasts 24-36 hours.      Relevant past medical, surgical, family and social history reviewed and updated as indicated. Interim medical history since our last visit reviewed. Allergies and medications reviewed and updated. Outpatient Medications Prior to Visit  Medication Sig Dispense Refill   buPROPion (WELLBUTRIN XL) 150 MG 24 hr tablet TAKE 1 TABLET BY MOUTH EVERY DAY 90 tablet 1   levonorgestrel (MIRENA) 20 MCG/24HR IUD 1 each by Intrauterine route once.     rizatriptan (MAXALT) 10 MG tablet Take 1 tablet (10 mg total) by mouth as needed for migraine. May repeat in 2 hours if needed 10 tablet 2   sertraline (ZOLOFT) 100 MG tablet Take 1 tablet (100 mg total) by mouth daily. 90 tablet 0   No facility-administered medications prior to visit.     Per HPI unless specifically indicated in ROS section below Review of Systems  Constitutional:  Negative for fatigue and fever.  HENT:  Negative for congestion.   Eyes:  Negative for pain.  Respiratory:  Negative for cough and shortness of breath.   Cardiovascular:  Negative for chest pain, palpitations and leg swelling.  Gastrointestinal:  Negative for abdominal pain.  Genitourinary:  Negative for dysuria and vaginal bleeding.  Musculoskeletal:  Negative for back  pain.  Neurological:  Positive for headaches. Negative for syncope and light-headedness.  Psychiatric/Behavioral:  Negative for dysphoric mood.    Objective:  Ht 5\' 5"  (1.651 m)   BMI 35.36 kg/m   Wt Readings from Last 3 Encounters:  08/31/21 212 lb 8 oz (96.4 kg)  01/10/21 197 lb 12.8 oz (89.7 kg)  11/14/20 195 lb (88.5 kg)      Physical Exam Constitutional:      General: She is not in acute distress.    Appearance: Normal appearance. She is well-developed. She is not  ill-appearing or toxic-appearing.  HENT:     Head: Normocephalic.     Right Ear: Hearing, tympanic membrane, ear canal and external ear normal. Tympanic membrane is not erythematous, retracted or bulging.     Left Ear: Hearing, tympanic membrane, ear canal and external ear normal. Tympanic membrane is not erythematous, retracted or bulging.     Nose: No mucosal edema or rhinorrhea.     Right Sinus: No maxillary sinus tenderness or frontal sinus tenderness.     Left Sinus: No maxillary sinus tenderness or frontal sinus tenderness.     Mouth/Throat:     Pharynx: Uvula midline.  Eyes:     General: Lids are normal. Lids are everted, no foreign bodies appreciated.     Conjunctiva/sclera: Conjunctivae normal.     Pupils: Pupils are equal, round, and reactive to light.  Neck:     Thyroid: No thyroid mass or thyromegaly.     Vascular: No carotid bruit.     Trachea: Trachea normal.  Cardiovascular:     Rate and Rhythm: Normal rate and regular rhythm.     Pulses: Normal pulses.     Heart sounds: Normal heart sounds, S1 normal and S2 normal. No murmur heard.    No friction rub. No gallop.  Pulmonary:     Effort: Pulmonary effort is normal. No tachypnea or respiratory distress.     Breath sounds: Normal breath sounds. No decreased breath sounds, wheezing, rhonchi or rales.  Abdominal:     General: Bowel sounds are normal.     Palpations: Abdomen is soft.     Tenderness: There is no abdominal tenderness.  Musculoskeletal:     Cervical back: Normal range of motion and neck supple.  Skin:    General: Skin is warm and dry.     Findings: No rash.  Neurological:     Mental Status: She is alert.  Psychiatric:        Mood and Affect: Mood is not anxious or depressed.        Speech: Speech normal.        Behavior: Behavior normal. Behavior is cooperative.        Thought Content: Thought content normal.        Judgment: Judgment normal.       Results for orders placed or performed in visit on  11/07/20  Comprehensive metabolic panel  Result Value Ref Range   Sodium 138 135 - 145 mEq/L   Potassium 4.2 3.5 - 5.1 mEq/L   Chloride 103 96 - 112 mEq/L   CO2 26 19 - 32 mEq/L   Glucose, Bld 90 70 - 99 mg/dL   BUN 8 6 - 23 mg/dL   Creatinine, Ser 01/07/21 0.40 - 1.20 mg/dL   Total Bilirubin 0.8 0.2 - 1.2 mg/dL   Alkaline Phosphatase 51 39 - 117 U/L   AST 15 0 - 37 U/L   ALT 17 0 - 35  U/L   Total Protein 7.1 6.0 - 8.3 g/dL   Albumin 4.4 3.5 - 5.2 g/dL   GFR 132.44 >01.02 mL/min   Calcium 9.3 8.4 - 10.5 mg/dL  Lipid panel  Result Value Ref Range   Cholesterol 151 0 - 200 mg/dL   Triglycerides 72.5 0.0 - 149.0 mg/dL   HDL 36.64 >40.34 mg/dL   VLDL 74.2 0.0 - 59.5 mg/dL   LDL Cholesterol 88 0 - 99 mg/dL   Total CHOL/HDL Ratio 3    NonHDL 98.81   Hemoglobin A1c  Result Value Ref Range   Hgb A1c MFr Bld 4.6 4.6 - 6.5 %     COVID 19 screen:  No recent travel or known exposure to COVID19 The patient denies respiratory symptoms of COVID 19 at this time. The importance of social distancing was discussed today.   Assessment and Plan Problem List Items Addressed This Visit     Migraine headache without aura    Chronic, with acute worsening given 12-hour work schedule and family health issues.  We discussed alternative migraine preventatives.  At this point we will have her just restart her previous dose of Topamax.  She will start 50 mg p.o. nightly and increase to 100 at bedtime.  She will use Maxalt for abortive treatment.  She will continue working on healthy lifestyle, decrease stress, adequate sleep, regular eating habits and increased water intake.  We will also keep her appointment with the headache wellness clinic at [facility] in the new year.  She will return FMLA paperwork for me to complete per her needs as detailed in the HPI.      Relevant Medications   rizatriptan (MAXALT-MLT) 10 MG disintegrating tablet   topiramate (TOPAMAX) 50 MG tablet   Other Visit Diagnoses      Screening-pulmonary TB    -  Primary   Relevant Orders   QuantiFERON-TB Gold Plus          Kerby Nora, MD

## 2021-10-08 NOTE — Assessment & Plan Note (Signed)
Chronic, with acute worsening given 12-hour work schedule and family health issues.  We discussed alternative migraine preventatives.  At this point we will have her just restart her previous dose of Topamax.  She will start 50 mg p.o. nightly and increase to 100 at bedtime.  She will use Maxalt for abortive treatment.  She will continue working on healthy lifestyle, decrease stress, adequate sleep, regular eating habits and increased water intake.  We will also keep her appointment with the headache wellness clinic at [facility] in the new year.  She will return FMLA paperwork for me to complete per her needs as detailed in the HPI.

## 2021-10-08 NOTE — Patient Instructions (Addendum)
Please stop at the lab to have labs drawn. Restart Topamax at  50 mg at bedtime.. increase to 100 mg at night as tolerated.  Use Maxalt for migraine treatment.

## 2021-10-10 ENCOUNTER — Telehealth: Payer: Self-pay | Admitting: Family Medicine

## 2021-10-10 ENCOUNTER — Ambulatory Visit: Payer: BC Managed Care – PPO

## 2021-10-10 ENCOUNTER — Other Ambulatory Visit (INDEPENDENT_AMBULATORY_CARE_PROVIDER_SITE_OTHER): Payer: BC Managed Care – PPO

## 2021-10-10 DIAGNOSIS — Z23 Encounter for immunization: Secondary | ICD-10-CM | POA: Diagnosis not present

## 2021-10-10 LAB — QUANTIFERON-TB GOLD PLUS
Mitogen-NIL: >10 IU/mL
NIL: 0.03 IU/mL
QuantiFERON-TB Gold Plus: NEGATIVE
TB1-NIL: 0 IU/mL
TB2-NIL: 0.01 IU/mL

## 2021-10-10 NOTE — Telephone Encounter (Signed)
-----   Message from Alvina Chou sent at 10/10/2021  7:45 AM EDT ----- Regarding: Lab orders for today, late add on Lab orders for titers

## 2021-10-11 LAB — VARICELLA ZOSTER ANTIBODY, IGG: Varicella IgG: 1610 index

## 2021-11-01 ENCOUNTER — Telehealth: Payer: Self-pay | Admitting: Family Medicine

## 2021-11-01 DIAGNOSIS — Z131 Encounter for screening for diabetes mellitus: Secondary | ICD-10-CM

## 2021-11-01 DIAGNOSIS — Z13220 Encounter for screening for lipoid disorders: Secondary | ICD-10-CM

## 2021-11-01 NOTE — Telephone Encounter (Signed)
-----   Message from Alvina Chou sent at 10/22/2021  3:20 PM EDT ----- Regarding: Lab orders for Thursday, 8.3.23 Patient is scheduled for CPX labs, please order future labs, Thanks , Camelia Eng

## 2021-11-08 ENCOUNTER — Other Ambulatory Visit: Payer: BC Managed Care – PPO

## 2021-11-15 ENCOUNTER — Encounter: Payer: BC Managed Care – PPO | Admitting: Family Medicine

## 2021-11-28 ENCOUNTER — Other Ambulatory Visit: Payer: Self-pay | Admitting: Family Medicine

## 2022-06-25 ENCOUNTER — Encounter: Payer: Self-pay | Admitting: Family Medicine

## 2022-06-27 ENCOUNTER — Other Ambulatory Visit: Payer: Self-pay | Admitting: Family Medicine

## 2022-06-27 DIAGNOSIS — L700 Acne vulgaris: Secondary | ICD-10-CM

## 2022-06-28 ENCOUNTER — Encounter: Payer: Self-pay | Admitting: *Deleted

## 2022-07-23 DIAGNOSIS — Z79899 Other long term (current) drug therapy: Secondary | ICD-10-CM | POA: Diagnosis not present

## 2022-07-23 DIAGNOSIS — L732 Hidradenitis suppurativa: Secondary | ICD-10-CM | POA: Diagnosis not present

## 2022-07-25 ENCOUNTER — Other Ambulatory Visit (HOSPITAL_COMMUNITY): Payer: Self-pay

## 2022-07-25 ENCOUNTER — Other Ambulatory Visit: Payer: Self-pay

## 2022-07-25 MED ORDER — COSENTYX UNOREADY 300 MG/2ML ~~LOC~~ SOAJ
SUBCUTANEOUS | 11 refills | Status: DC
  Filled 2022-09-19: qty 2, 28d supply, fill #0
  Filled 2022-10-18: qty 2, 28d supply, fill #1
  Filled 2022-11-20: qty 2, 28d supply, fill #2
  Filled 2022-12-12: qty 2, 28d supply, fill #3
  Filled 2023-01-09: qty 2, 28d supply, fill #4
  Filled 2023-02-13: qty 2, 28d supply, fill #5
  Filled 2023-03-11: qty 2, 28d supply, fill #6
  Filled 2023-04-16: qty 2, 28d supply, fill #7

## 2022-07-25 MED ORDER — COSENTYX UNOREADY 300 MG/2ML ~~LOC~~ SOAJ
SUBCUTANEOUS | 0 refills | Status: DC
  Filled 2022-07-25 – 2022-07-30 (×3): qty 10, 30d supply, fill #0

## 2022-07-29 DIAGNOSIS — Z79899 Other long term (current) drug therapy: Secondary | ICD-10-CM | POA: Diagnosis not present

## 2022-07-30 ENCOUNTER — Other Ambulatory Visit: Payer: Self-pay

## 2022-07-30 ENCOUNTER — Other Ambulatory Visit (HOSPITAL_COMMUNITY): Payer: Self-pay

## 2022-07-31 ENCOUNTER — Other Ambulatory Visit (HOSPITAL_COMMUNITY): Payer: Self-pay

## 2022-07-31 ENCOUNTER — Other Ambulatory Visit: Payer: Self-pay

## 2022-08-12 ENCOUNTER — Telehealth: Payer: Self-pay | Admitting: *Deleted

## 2022-08-12 NOTE — Telephone Encounter (Signed)
I called patient and LVM that her 5/7 appt has been canceled due to Dr Vickey Huger not being in the office tomorrow. I apologized this was last minute. I asked the patient to call us back during office hours asap so we can get her rescheduled.

## 2022-08-13 ENCOUNTER — Telehealth: Payer: Self-pay | Admitting: Neurology

## 2022-08-13 ENCOUNTER — Ambulatory Visit: Payer: Self-pay | Admitting: Neurology

## 2022-08-21 ENCOUNTER — Other Ambulatory Visit (HOSPITAL_COMMUNITY): Payer: Self-pay

## 2022-08-26 ENCOUNTER — Other Ambulatory Visit (HOSPITAL_COMMUNITY): Payer: Self-pay

## 2022-08-30 IMAGING — CT CT RENAL STONE PROTOCOL
2 of 4 series · 16 of 46 positions shown, 18 images · non-contrast
Comparison: None.

CLINICAL DATA: Hematuria.

EXAM:
CT ABDOMEN AND PELVIS WITHOUT CONTRAST
TECHNIQUE: Multidetector CT imaging of the abdomen and pelvis was performed
following the standard protocol without IV contrast.

[Series 2: axial st · axial · 0.80mm/px · z∈[+1034,+1484]mm · 13 of 102 slices shown, 15 images]
[im 6/102  soft-tissue]
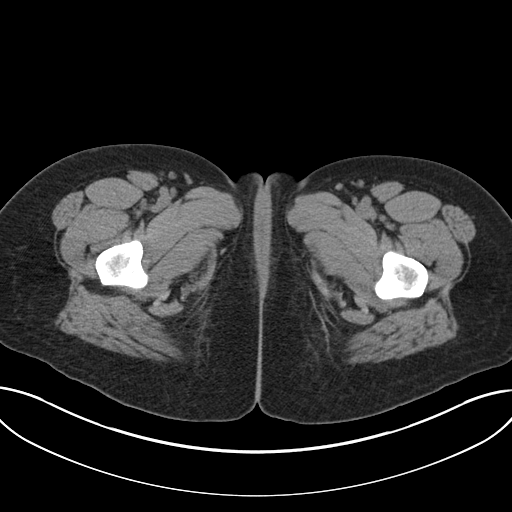
[im 6/102  bone]
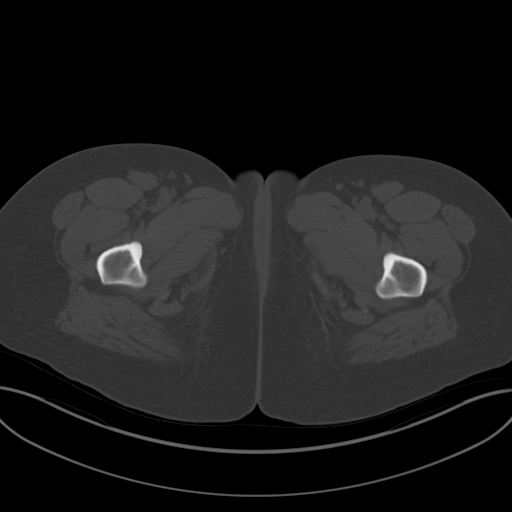
[im 12/102  soft-tissue]
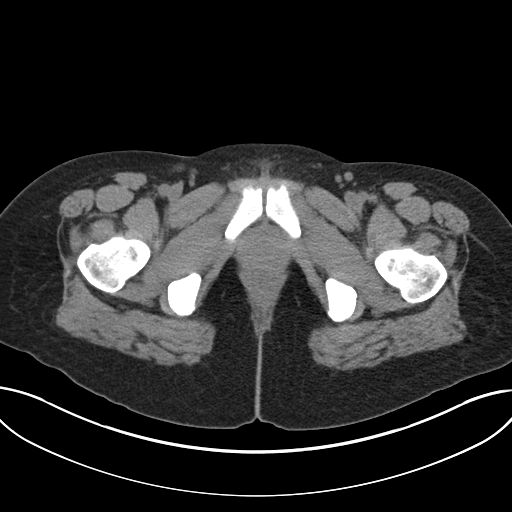
[im 23/102  soft-tissue]
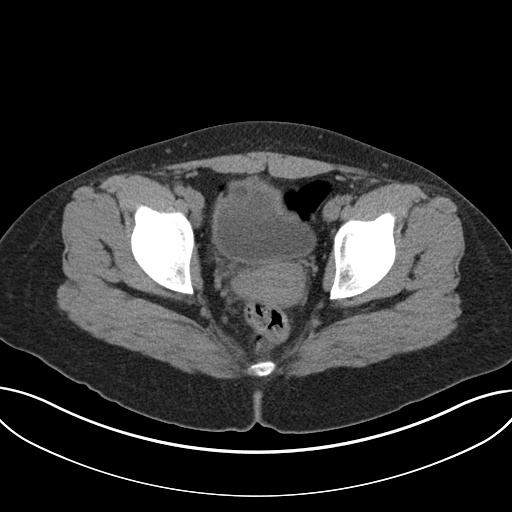
[im 29/102  soft-tissue]
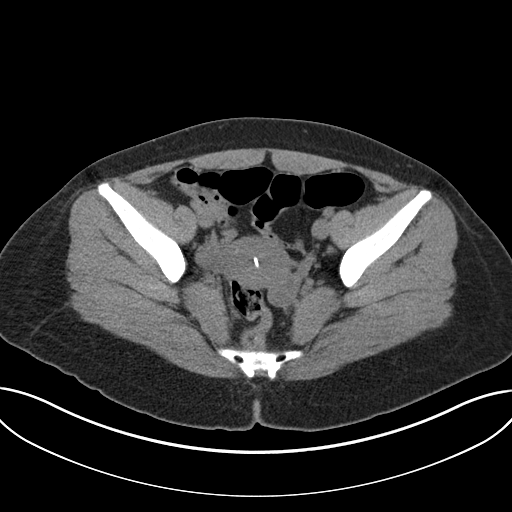
[im 34/102  soft-tissue]
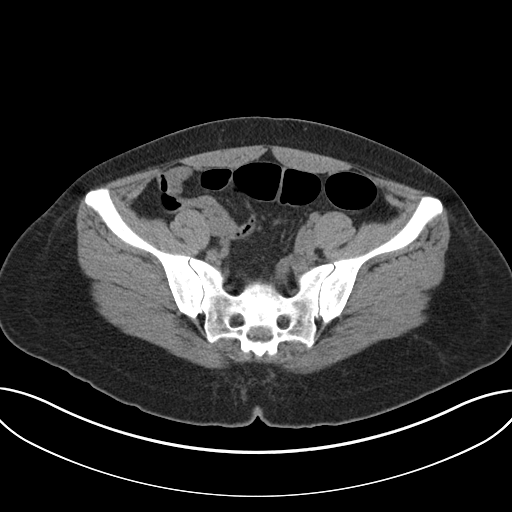
[im 45/102  soft-tissue]
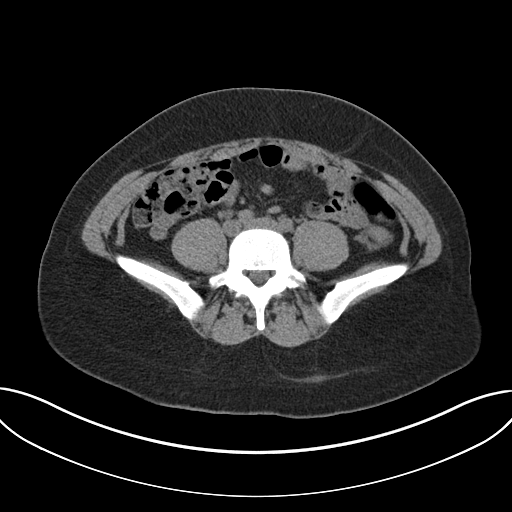
[im 51/102  soft-tissue]
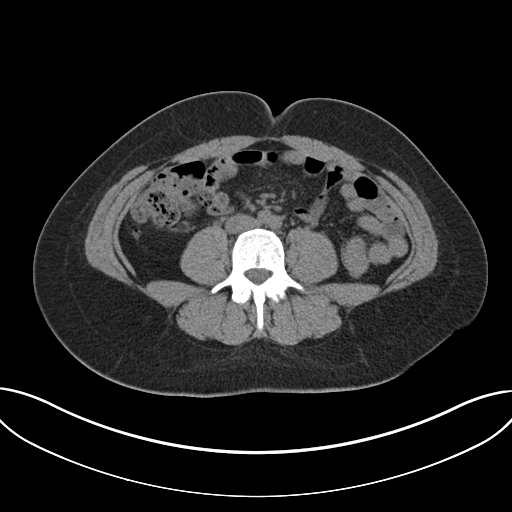
[im 57/102  soft-tissue]
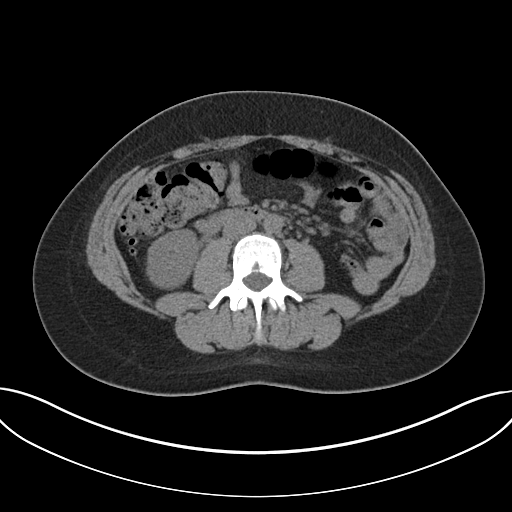
[im 68/102  soft-tissue]
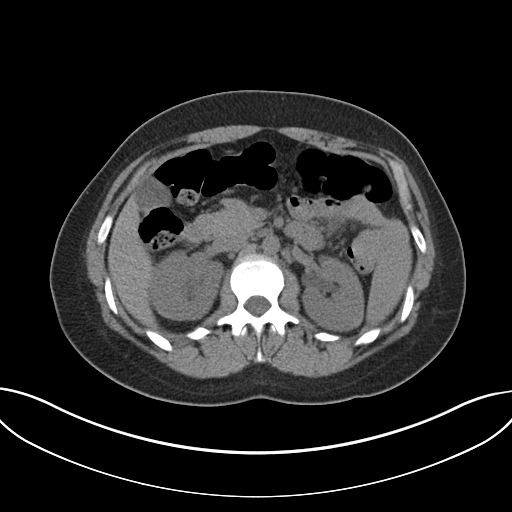
[im 68/102  bone]
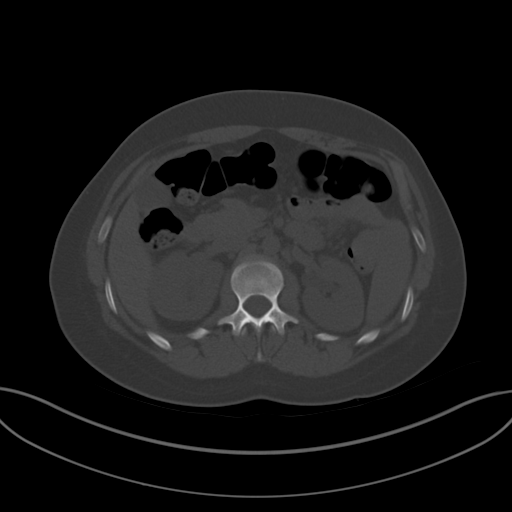
[im 73/102  soft-tissue]
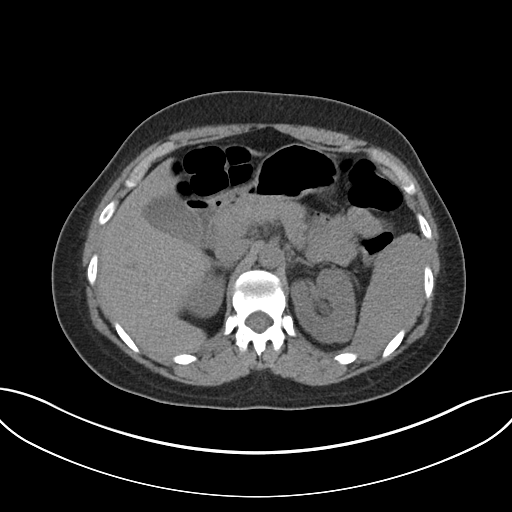
[im 79/102  soft-tissue]
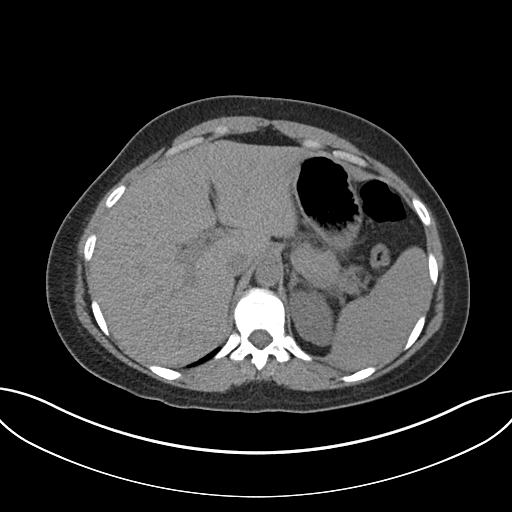
[im 90/102  soft-tissue]
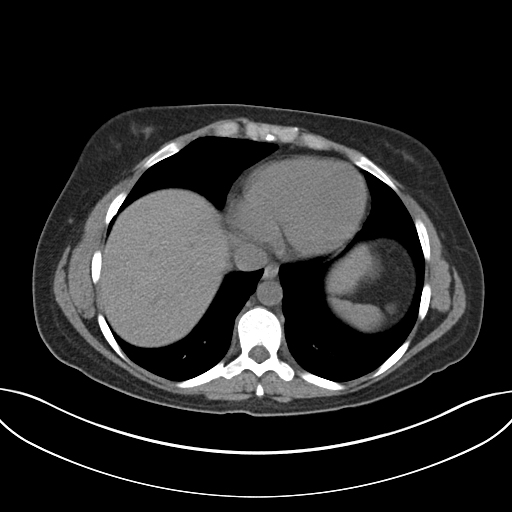
[im 96/102  soft-tissue]
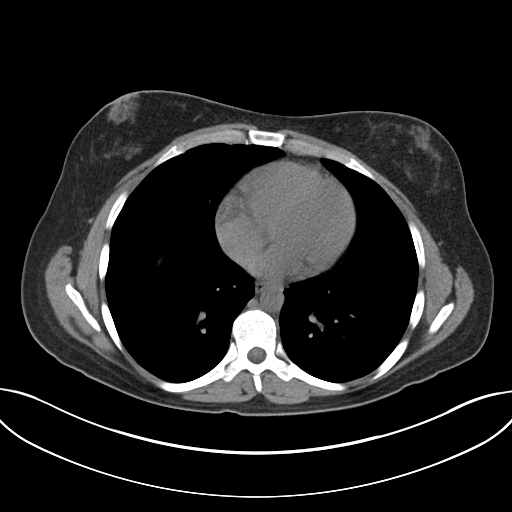

[Series 5: coronal · coronal · 0.80mm/px · 3 of 139 slices shown]
[im 47/139  soft-tissue]
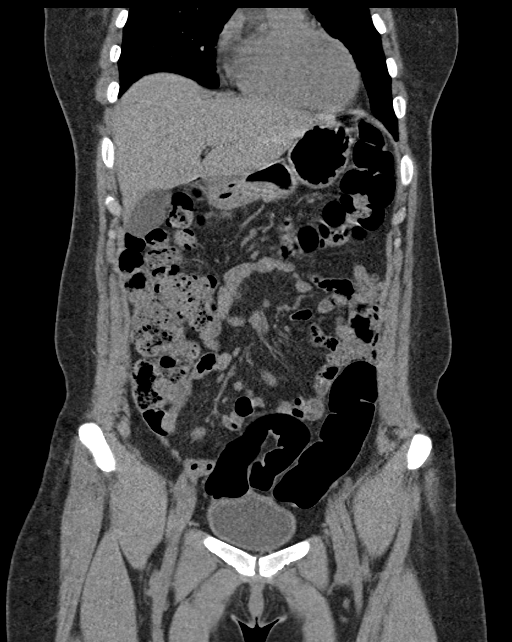
[im 62/139  soft-tissue]
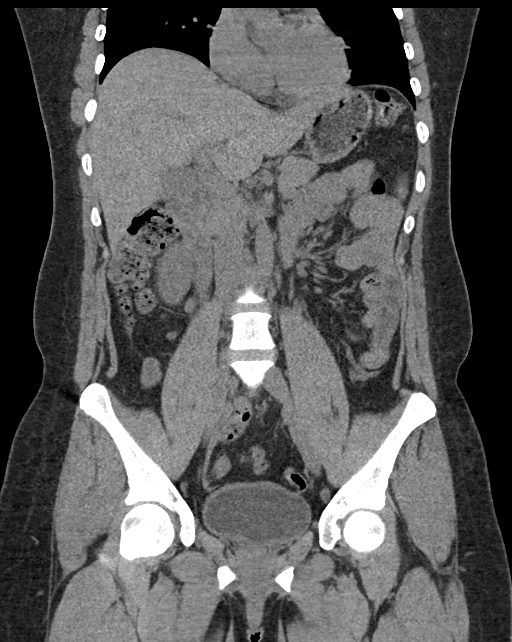
[im 77/139  soft-tissue]
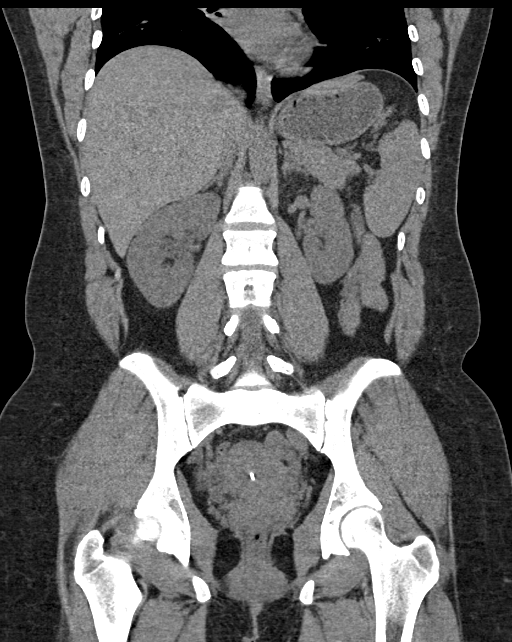

[16 of 46 positions shown; findings below may reference images not displayed]

FINDINGS: Lower chest: The lung bases are clear. The heart size is normal.

Hepatobiliary: The liver is normal. Normal gallbladder.There is no
biliary ductal dilation.

Pancreas: Normal contours without ductal dilatation. No
peripancreatic fluid collection.

Spleen: Unremarkable.

Adrenals/Urinary Tract:

--Adrenal glands: Unremarkable.

--Right kidney/ureter: There is mild right-sided collecting system
dilatation evidence for an obstructing stone. There is haziness
about the right kidney and right ureter.

--Left kidney/ureter: No hydronephrosis or radiopaque kidney stones.

--Urinary bladder: There is mild wall thickening of the urinary
bladder.

Stomach/Bowel:

--Stomach/Duodenum: No hiatal hernia or other gastric abnormality.
Normal duodenal course and caliber.

--Small bowel: Unremarkable.

--Colon: Unremarkable.

--Appendix: Normal.

Vascular/Lymphatic: Normal course and caliber of the major abdominal
vessels.

--No retroperitoneal lymphadenopathy.

--No mesenteric lymphadenopathy.

--No pelvic or inguinal lymphadenopathy.

Reproductive: There is an IUD in place.

Other: No ascites or free air. The abdominal wall is normal.

Musculoskeletal. No acute displaced fractures.
IMPRESSION: 1. Mild right-sided collecting system dilatation with haziness about
the right kidney and right ureter. No evidence for an obstructing
stone. Findings may be secondary to a recently passed stone or an
ascending urinary tract infection. Correlation with urinalysis is
recommended.
2. Mild wall thickening of the urinary bladder. Correlate with
urinalysis to exclude cystitis.

## 2022-09-16 ENCOUNTER — Other Ambulatory Visit (HOSPITAL_COMMUNITY): Payer: Self-pay

## 2022-09-19 ENCOUNTER — Other Ambulatory Visit (HOSPITAL_COMMUNITY): Payer: Self-pay

## 2022-09-20 ENCOUNTER — Other Ambulatory Visit (HOSPITAL_COMMUNITY): Payer: Self-pay

## 2022-10-07 ENCOUNTER — Telehealth: Payer: Self-pay | Admitting: Family Medicine

## 2022-10-07 DIAGNOSIS — Z111 Encounter for screening for respiratory tuberculosis: Secondary | ICD-10-CM

## 2022-10-07 NOTE — Telephone Encounter (Signed)
Lab appt scheduled and reminded patient to schedule for CPE when able

## 2022-10-07 NOTE — Telephone Encounter (Signed)
Julie Mckenzie's last office visit 10/08/21.  Does she need CPE prior or can she come in for labs only.  Please advise.

## 2022-10-07 NOTE — Telephone Encounter (Signed)
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 Can orders be submitted? Pt states she needs test done asap. Call back # [phone]

## 2022-10-07 NOTE — Telephone Encounter (Signed)
Patient contacted the office again regarding this issue, asked if there was any response from Dr. Ermalene Searing. Advised patient that Dr. 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 Patient asked if another provider could put the order in for this test, advised patient I would send a message to ask. Please advise, thank you.

## 2022-10-07 NOTE — Telephone Encounter (Signed)
Please go ahead and schedule her a lab appointment for her Julie Mckenzie but also remind her she is due for her Annual Physical.

## 2022-10-08 ENCOUNTER — Other Ambulatory Visit (INDEPENDENT_AMBULATORY_CARE_PROVIDER_SITE_OTHER): Payer: 59

## 2022-10-08 DIAGNOSIS — Z111 Encounter for screening for respiratory tuberculosis: Secondary | ICD-10-CM

## 2022-10-10 LAB — QUANTIFERON-TB GOLD PLUS
Mitogen-NIL: 9.94 IU/mL
NIL: 0.03 IU/mL
QuantiFERON-TB Gold Plus: NEGATIVE
TB1-NIL: 0.15 IU/mL
TB2-NIL: 0.2 IU/mL

## 2022-10-18 ENCOUNTER — Other Ambulatory Visit (HOSPITAL_COMMUNITY): Payer: Self-pay

## 2022-10-28 ENCOUNTER — Telehealth: Payer: Self-pay | Admitting: *Deleted

## 2022-10-28 DIAGNOSIS — Z13220 Encounter for screening for lipoid disorders: Secondary | ICD-10-CM

## 2022-10-28 NOTE — Telephone Encounter (Signed)
-----   Message from Lovena Neighbours sent at 10/28/2022  4:07 PM EDT ----- Regarding: Labs for 8.7.24 Please put physical lab orders in future. Thank you, Denny Peon

## 2022-11-05 ENCOUNTER — Ambulatory Visit: Payer: 59 | Admitting: Family Medicine

## 2022-11-05 DIAGNOSIS — Z79899 Other long term (current) drug therapy: Secondary | ICD-10-CM | POA: Diagnosis not present

## 2022-11-05 DIAGNOSIS — L732 Hidradenitis suppurativa: Secondary | ICD-10-CM | POA: Diagnosis not present

## 2022-11-06 ENCOUNTER — Encounter (INDEPENDENT_AMBULATORY_CARE_PROVIDER_SITE_OTHER): Payer: Self-pay

## 2022-11-12 ENCOUNTER — Other Ambulatory Visit (HOSPITAL_COMMUNITY): Payer: Self-pay

## 2022-11-13 ENCOUNTER — Other Ambulatory Visit: Payer: 59

## 2022-11-15 ENCOUNTER — Other Ambulatory Visit (HOSPITAL_COMMUNITY): Payer: Self-pay

## 2022-11-18 ENCOUNTER — Other Ambulatory Visit (HOSPITAL_COMMUNITY): Payer: Self-pay

## 2022-11-18 ENCOUNTER — Encounter (HOSPITAL_COMMUNITY): Payer: Self-pay

## 2022-11-20 ENCOUNTER — Ambulatory Visit: Payer: 59 | Admitting: Family Medicine

## 2022-11-20 ENCOUNTER — Encounter: Payer: 59 | Admitting: Family Medicine

## 2022-11-20 ENCOUNTER — Other Ambulatory Visit (HOSPITAL_COMMUNITY): Payer: Self-pay

## 2022-11-21 ENCOUNTER — Encounter: Payer: Self-pay | Admitting: Family Medicine

## 2022-12-12 ENCOUNTER — Other Ambulatory Visit (HOSPITAL_COMMUNITY): Payer: Self-pay

## 2022-12-28 ENCOUNTER — Encounter (HOSPITAL_COMMUNITY): Payer: Self-pay

## 2023-01-09 ENCOUNTER — Other Ambulatory Visit: Payer: Self-pay

## 2023-01-09 NOTE — Progress Notes (Signed)
Specialty Pharmacy Refill Coordination Note  Julie Mckenzie is a 27 y.o. female contacted today regarding refills of specialty medication(s) Secukinumab   Patient requested Delivery   Delivery date: 01/21/23   Verified address: [address] [place], [address]   Medication will be filled on 01/20/2023.

## 2023-01-13 ENCOUNTER — Other Ambulatory Visit: Payer: Self-pay

## 2023-01-19 ENCOUNTER — Ambulatory Visit
Admission: RE | Admit: 2023-01-19 | Discharge: 2023-01-19 | Disposition: A | Discharge status: Home / Self Care | Payer: 59 | Source: Ambulatory Visit

## 2023-01-19 VITALS — BP 156/92 | HR 103 | Temp 98.6°F | Resp 18

## 2023-01-19 DIAGNOSIS — J0100 Acute maxillary sinusitis, unspecified: Secondary | ICD-10-CM | POA: Diagnosis not present

## 2023-01-19 DIAGNOSIS — H66002 Acute suppurative otitis media without spontaneous rupture of ear drum, left ear: Secondary | ICD-10-CM

## 2023-01-19 MED ORDER — AMOXICILLIN-POT CLAVULANATE 875-125 MG PO TABS: 1.0000 | ORAL_TABLET | Freq: Two times a day (BID) | ORAL | 0 refills | Status: DC

## 2023-01-19 MED ORDER — FLUTICASONE PROPIONATE 50 MCG/ACT NA SUSP: 1.0000 | Freq: Two times a day (BID) | NASAL | 2 refills | Status: DC

## 2023-01-19 NOTE — ED Provider Notes (Signed)
[facility]    CSN: [id number] Arrival date & time: 01/19/23  1349      History   Chief Complaint Chief Complaint  Patient presents with   Sore Throat    Sore throat/throat pain, ear pain, sinus pain - Entered by patient    HPI Julie Mckenzie is a 27 y.o. female.   Presented today with 1 week history of progressively worsening nasal congestion, facial pain and pressure, sore throat, bilateral ear pain, cough.  Denies fever, chest pain, shortness of breath, abdominal pain, nausea vomiting or diarrhea.  Taking DayQuil with minimal relief.    Past Medical History:  Diagnosis Date   Acne    Migraine headache without aura 03/01/2014   Motion sickness    car passenger, boats   Post-concussion headache    age 27    Patient Active Problem List   Diagnosis Date Noted   Moderate episode of recurrent major depressive disorder (HCC) 08/31/2021   School physical exam 08/31/2021   BMI 32.0-32.9,adult 11/14/2020   Pyelonephritis 05/05/2020   Hypokalemia 05/05/2020   Generalized anxiety disorder 12/19/2015   Migraine headache without aura 03/01/2014   Seasonal allergies 09/07/2013   ACNE VULGARIS 08/09/2008    Past Surgical History:  Procedure Laterality Date   ADENOIDECTOMY Bilateral 11/25/2018   Procedure: ADENOIDECTOMY;  Surgeon: Bud Face, MD;  Location: [facility];  Service: ENT;  Laterality: Bilateral;   TONSILLECTOMY Bilateral 11/25/2018   Procedure: TONSILLECTOMY;  Surgeon: Bud Face, MD;  Location: [facility];  Service: ENT;  Laterality: Bilateral;    OB History   No obstetric history on file.      Home Medications    Prior to Admission medications   Medication Sig Start Date End Date Taking? Authorizing Provider  amoxicillin-clavulanate (AUGMENTIN) 875-125 MG tablet Take 1 tablet by mouth every 12 (twelve) hours. 01/19/23  Yes Particia Nearing, PA-C  fluticasone [facility]) 50 MCG/ACT nasal spray Place  1 spray into both nostrils 2 (two) times daily. 01/19/23  Yes Particia Nearing, PA-C  spironolactone (ALDACTONE) 25 MG tablet Take 25 mg by mouth daily.   Yes [provider]  rizatriptan (MAXALT-MLT) 10 MG disintegrating tablet Take 1 tablet (10 mg total) by mouth as needed for migraine. May repeat in 2 hours if needed 10/08/21   Bedsole, Amy E, MD  Secukinumab (COSENTYX UNOREADY) 300 MG/2ML SOAJ Inject the contents of 1 pens (300mg ) under the skin weekly for 5 weeks as loading doses (on weeks 0,1,2,3,4) 07/25/22     Secukinumab (COSENTYX UNOREADY) 300 MG/2ML SOAJ Inject the contents of 1 pen  (300 mg) under the skin every twenty-eight (28) days. Maintenance dosing. 07/25/22       Family History Family History  Problem Relation Age of Onset   Cancer Father        colorectal   Cancer Paternal Grandmother        lung   Hypertension Paternal Grandfather    Cancer Other        colon    Social History Social History   Tobacco Use   Smoking status: Never   Smokeless tobacco: Never  Vaping Use   Vaping status: Never Used  Substance Use Topics   Alcohol use: Not Currently   Drug use: No     Allergies   Sulfa antibiotics and Toradol [ketorolac tromethamine]   Review of Systems Review of Systems Per HPI  Physical Exam Triage Vital Signs ED Triage Vitals  Encounter Vitals Group  BP 01/19/23 1423 (!) 156/92     Systolic BP Percentile --      Diastolic BP Percentile --      Pulse Rate 01/19/23 1423 (!) 103     Resp 01/19/23 1423 18     Temp 01/19/23 1423 98.6 F (37 C)     Temp Source 01/19/23 1423 Oral     SpO2 01/19/23 1423 98 %     Weight --      Height --      Head Circumference --      Peak Flow --      Pain Score 01/19/23 1425 7     Pain Loc --      Pain Education --      Exclude from Growth Chart --    No data found.  Updated Vital Signs BP (!) 156/92 (BP Location: Right Arm)   Pulse (!) 103   Temp 98.6 F (37 C) (Oral)   Resp 18   SpO2  98%   Visual Acuity Right Eye Distance:   Left Eye Distance:   Bilateral Distance:    Right Eye Near:   Left Eye Near:    Bilateral Near:     Physical Exam Vitals and nursing note reviewed.  Constitutional:      Appearance: Normal appearance.  HENT:     Head: Atraumatic.     Right Ear: Tympanic membrane and external ear normal.     Left Ear: External ear normal.     Ears:     Comments: Left TM erythematous and edematous    Nose: Congestion present.     Mouth/Throat:     Mouth: Mucous membranes are moist.     Pharynx: Posterior oropharyngeal erythema present.  Eyes:     Extraocular Movements: Extraocular movements intact.     Conjunctiva/sclera: Conjunctivae normal.  Cardiovascular:     Rate and Rhythm: Normal rate and regular rhythm.     Heart sounds: Normal heart sounds.  Pulmonary:     Effort: Pulmonary effort is normal.     Breath sounds: Normal breath sounds. No wheezing.  Musculoskeletal:        General: Normal range of motion.     Cervical back: Normal range of motion and neck supple.  Skin:    General: Skin is warm and dry.  Neurological:     Mental Status: She is alert and oriented to person, place, and time.  Psychiatric:        Mood and Affect: Mood normal.        Thought Content: Thought content normal.      UC Treatments / Results  Labs (all labs ordered are listed, but only abnormal results are displayed) Labs Reviewed - No data to display  EKG   Radiology No results found.  Procedures Procedures (including critical care time)  Medications Ordered in UC Medications - No data to display  Initial Impression / Assessment and Plan / UC Course  I have reviewed the triage vital signs and the nursing notes.  Pertinent labs & imaging results that were available during my care of the patient were reviewed by me and considered in my medical decision making (see chart for details).     Given duration worsening course, treat with Augmentin,  Flonase, supportive over-the-counter medications and home care.  Return for worsening symptoms.  Final Clinical Impressions(s) / UC Diagnoses   Final diagnoses:  Acute non-recurrent maxillary sinusitis  Acute suppurative otitis media of left ear without  spontaneous rupture of tympanic membrane, recurrence not specified   Discharge Instructions   None    ED Prescriptions     Medication Sig Dispense Auth. Provider   amoxicillin-clavulanate (AUGMENTIN) 875-125 MG tablet Take 1 tablet by mouth every 12 (twelve) hours. 14 tablet Particia Nearing, PA-C   fluticasone [facility]) 50 MCG/ACT nasal spray Place 1 spray into both nostrils 2 (two) times daily. 16 g Particia Nearing, [place]      PDMP not reviewed this encounter.   Particia Nearing, [place] 01/19/23 1457

## 2023-01-19 NOTE — ED Triage Notes (Signed)
Nasal congestion, sore throat and bilateral ear pain x 1 week.  C/o facial pain and productive cough with green sputum

## 2023-01-29 ENCOUNTER — Ambulatory Visit: Payer: 59 | Admitting: Family Medicine

## 2023-01-29 ENCOUNTER — Encounter: Payer: Self-pay | Admitting: Family Medicine

## 2023-01-29 VITALS — BP 136/74 | HR 98 | Temp 99.1°F | Ht 66.5 in | Wt 216.5 lb

## 2023-01-29 DIAGNOSIS — F331 Major depressive disorder, recurrent, moderate: Secondary | ICD-10-CM | POA: Diagnosis not present

## 2023-01-29 DIAGNOSIS — E66811 Obesity, class 1: Secondary | ICD-10-CM

## 2023-01-29 DIAGNOSIS — E6609 Other obesity due to excess calories: Secondary | ICD-10-CM

## 2023-01-29 DIAGNOSIS — Z6834 Body mass index (BMI) 34.0-34.9, adult: Secondary | ICD-10-CM

## 2023-01-29 DIAGNOSIS — F411 Generalized anxiety disorder: Secondary | ICD-10-CM | POA: Diagnosis not present

## 2023-01-29 LAB — TSH: TSH: 3.56 u[IU]/mL (ref 0.35–5.50)

## 2023-01-29 MED ORDER — RIZATRIPTAN BENZOATE 10 MG PO TBDP: 10.0000 mg | ORAL_TABLET | ORAL | 0 refills | Status: AC | PRN

## 2023-01-29 MED ORDER — BUPROPION HCL ER (XL) 150 MG PO TB24: 150.0000 mg | ORAL_TABLET | Freq: Every day | ORAL | 6 refills | Status: DC

## 2023-01-29 NOTE — Assessment & Plan Note (Signed)
Chronic, poor control She would like to avoid SSRIs given weight gain in the past.

## 2023-01-29 NOTE — Progress Notes (Signed)
Patient ID: Julie Mckenzie, female    DOB: May 24, 1995, 27 y.o.   MRN: [id number]  This visit was conducted in person.  BP 136/74   Pulse 98   Temp 99.1 F (37.3 C) (Oral)   Ht 5' 6.5" (1.689 m)   Wt 216 lb 8 oz (98.2 kg)   SpO2 98%   BMI 34.42 kg/m    CC:  Chief Complaint  Patient presents with   Medication Management    Patient is interested in contrave   Anxiety    Patient speaks about anxiety and depression as well    Subjective:   HPI: Julie Mckenzie is a 27 y.o. female presenting on 01/29/2023 for Medication Management (Patient is interested in contrave) and Anxiety (Patient speaks about anxiety and depression as well)  She has been having issues with mood in last 2 years.  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  MDD, GAD: Poor control. Decreased motivation, worrying, anxious, overwhelmed. referred to counselor in past... never got set up with counselor as she wanted to get in person. In past has tried sertraline 100 mg daily and Wellbutrin Xl 150 mg daily... helped some but ran out.  Cologuard  Trying to do smaller meals, skipping meal.  Pasta for dinner.      01/29/2023   11:40 AM 08/31/2021   10:25 AM 11/14/2020   10:22 AM  Depression screen PHQ 2/9  Decreased Interest 0 1 1  Down, Depressed, Hopeless 1 1 1   PHQ - 2 Score 1 2 2   Altered sleeping 2 3 1   Tired, decreased energy 3 3 1   Change in appetite 3 1 1   Feeling bad or failure about yourself  0 0 2  Trouble concentrating 2 1 2   Moving slowly or fidgety/restless 2 2 1   Suicidal thoughts 0 0 0  PHQ-9 Score 13 12 10   Difficult doing work/chores Somewhat difficult Somewhat difficult       01/29/2023   11:40 AM 08/31/2021   10:25 AM 07/23/2019   10:32 AM 01/16/2016    3:02 PM  GAD 7 : Generalized Anxiety Score  Nervous, Anxious, on Edge 3 1 1 1   Control/stop worrying 3 2 3  0  Worry too much - different things 3 2 3  0  Trouble relaxing 3 1 3  0  Restless 2 0 3 0  Easily annoyed or  irritable 2 0 0 0  Afraid - awful might happen 3 1 0 0  Total GAD 7 Score 19 7 13 1   Anxiety Difficulty Very difficult Somewhat difficult Somewhat difficult Somewhat difficult           Relevant past medical, surgical, family and social history reviewed and updated as indicated. Interim medical history since our last visit reviewed. Allergies and medications reviewed and updated. Outpatient Medications Prior to Visit  Medication Sig Dispense Refill   Secukinumab (COSENTYX UNOREADY) 300 MG/2ML SOAJ Inject the contents of 1 pens (300mg ) under the skin weekly for 5 weeks as loading doses (on weeks 0,1,2,3,4) 10 mL 0   Secukinumab (COSENTYX UNOREADY) 300 MG/2ML SOAJ Inject the contents of 1 pen  (300 mg) under the skin every twenty-eight (28) days. Maintenance dosing. 2 mL 11   spironolactone (ALDACTONE) 25 MG tablet Take 25 mg by mouth daily.     rizatriptan (MAXALT-MLT) 10 MG disintegrating tablet Take 1 tablet (10 mg total) by mouth as needed for migraine. May repeat in 2 hours if needed 10 tablet 0   spironolactone (ALDACTONE)  100 MG tablet Take 100 mg by mouth daily.     amoxicillin-clavulanate (AUGMENTIN) 875-125 MG tablet Take 1 tablet by mouth every 12 (twelve) hours. 14 tablet 0   fluticasone (FLONASE) 50 MCG/ACT nasal spray Place 1 spray into both nostrils 2 (two) times daily. 16 g 2   No facility-administered medications prior to visit.     Per HPI unless specifically indicated in ROS section below Review of Systems  Constitutional:  Negative for fatigue and fever.  HENT:  Negative for congestion.   Eyes:  Negative for pain.  Respiratory:  Negative for cough and shortness of breath.   Cardiovascular:  Negative for chest pain, palpitations and leg swelling.  Gastrointestinal:  Negative for abdominal pain.  Genitourinary:  Negative for dysuria and vaginal bleeding.  Musculoskeletal:  Negative for back pain.  Neurological:  Negative for syncope, light-headedness and headaches.   Psychiatric/Behavioral:  Negative for dysphoric mood.    Objective:  BP 136/74   Pulse 98   Temp 99.1 F (37.3 C) (Oral)   Ht 5' 6.5" (1.689 m)   Wt 216 lb 8 oz (98.2 kg)   SpO2 98%   BMI 34.42 kg/m   Wt Readings from Last 3 Encounters:  01/29/23 216 lb 8 oz (98.2 kg)  10/08/21 211 lb 7 oz (95.9 kg)  08/31/21 212 lb 8 oz (96.4 kg)      Physical Exam Constitutional:      General: She is not in acute distress.    Appearance: Normal appearance. She is well-developed. She is obese. She is not ill-appearing or toxic-appearing.  HENT:     Head: Normocephalic.     Right Ear: Hearing, tympanic membrane, ear canal and external ear normal. Tympanic membrane is not erythematous, retracted or bulging.     Left Ear: Hearing, tympanic membrane, ear canal and external ear normal. Tympanic membrane is not erythematous, retracted or bulging.     Nose: No mucosal edema or rhinorrhea.     Right Sinus: No maxillary sinus tenderness or frontal sinus tenderness.     Left Sinus: No maxillary sinus tenderness or frontal sinus tenderness.     Mouth/Throat:     Mouth: Oropharynx is clear and moist and mucous membranes are normal.     Pharynx: Uvula midline.  Eyes:     General: Lids are normal. Lids are everted, no foreign bodies appreciated.     Extraocular Movements: EOM normal.     Conjunctiva/sclera: Conjunctivae normal.     Pupils: Pupils are equal, round, and reactive to light.  Neck:     Thyroid: No thyroid mass or thyromegaly.     Vascular: No carotid bruit.     Trachea: Trachea normal.  Cardiovascular:     Rate and Rhythm: Normal rate and regular rhythm.     Pulses: Normal pulses.     Heart sounds: Normal heart sounds, S1 normal and S2 normal. No murmur heard.    No friction rub. No gallop.  Pulmonary:     Effort: Pulmonary effort is normal. No tachypnea or respiratory distress.     Breath sounds: Normal breath sounds. No decreased breath sounds, wheezing, rhonchi or rales.   Abdominal:     General: Bowel sounds are normal.     Palpations: Abdomen is soft.     Tenderness: There is no abdominal tenderness.  Musculoskeletal:     Cervical back: Normal range of motion and neck supple.  Skin:    General: Skin is warm, dry and intact.  Findings: No rash.  Neurological:     Mental Status: She is alert.  Psychiatric:        Mood and Affect: Mood is not anxious or depressed.        Speech: Speech normal.        Behavior: Behavior normal. Behavior is cooperative.        Thought Content: Thought content normal.        Cognition and Memory: Cognition and memory normal.        Judgment: Judgment normal.       Results for orders placed or performed in visit on 10/08/22  QuantiFERON-TB Gold Plus  Result Value Ref Range   QuantiFERON-TB Gold Plus NEGATIVE NEGATIVE   NIL 0.03 IU/mL   Mitogen-NIL 9.94 IU/mL   TB1-NIL 0.15 IU/mL   TB2-NIL 0.20 IU/mL    Assessment and Plan  Moderate episode of recurrent major depressive disorder (HCC) Assessment & Plan: Chronic, inadequate control.  Anxiety symptoms more significant.  Encouraged patient to set up counseling. Will try restarting Wellbutrin XL 150 mg p.o. daily Follow-up with MyChart update in 4 weeks.  Orders: -     Amb Ref to Medical Weight Management  Class 1 obesity due to excess calories with serious comorbidity and body mass index (BMI) of 34.0 to 34.9 in adult Assessment & Plan: Chronic, most difficulties started following stress and loss of her father in the last 2 years. She has been working on the healthy lifestyle, decrease portion size and regular exercise. She does report a very busy life and frequently skips lunch or any meal while at work.  She then finds she overeats at that time.  We discussed different medication options.  Phentermine is not a good option for her given anxiety, Qsymia may be a better option given she would be able to use a lower dose of phentermine and she has tolerated  topiramate in the past.  Recommended improved control of her anxiety prior to starting Qsymia. GLP-1 inhibitor is not covered by her insurance. Encouraged her to begin to lose discussions discounted weight watchers program and to start using a tracking app. Encouraged her to increase activity at least finding time to do 10 minutes of walking each day. Referral placed for healthy weight and wellness medical bariatric clinic. Discussed that Contrave is not a medication that I prescribe it.  Orders: -     Amb Ref to Medical Weight Management -     TSH  Generalized anxiety disorder Assessment & Plan: Chronic, poor control She would like to avoid SSRIs given weight gain in the past.   Other orders -     Rizatriptan Benzoate; Take 1 tablet (10 mg total) by mouth as needed for migraine. May repeat in 2 hours if needed  Dispense: 10 tablet; Refill: 0 -     buPROPion HCl ER (XL); Take 1 tablet (150 mg total) by mouth daily.  Dispense: 30 tablet; Refill: 6    No follow-ups on file.   Kerby Nora, MD

## 2023-01-29 NOTE — Patient Instructions (Signed)
Send a message via MyChart with update in 4 weeks.  Restart Wellbutrin.  Call around for counseling options in person.  Try to walk daily at least 10 min for stress reduction.  Start some type of  tracking app.  We will move forward with bariatric referral. .

## 2023-01-29 NOTE — Assessment & Plan Note (Signed)
Chronic, most difficulties started following stress and loss of her father in the last 2 years. She has been working on the healthy lifestyle, decrease portion size and regular exercise. She does report a very busy life and frequently skips lunch or any meal while at work.  She then finds she overeats at that time.  We discussed different medication options.  Phentermine is not a good option for her given anxiety, Qsymia may be a better option given she would be able to use a lower dose of phentermine and she has tolerated topiramate in the past.  Recommended improved control of her anxiety prior to starting Qsymia. GLP-1 inhibitor is not covered by her insurance. Encouraged her to begin to lose discussions discounted weight watchers program and to start using a tracking app. Encouraged her to increase activity at least finding time to do 10 minutes of walking each day. Referral placed for healthy weight and wellness medical bariatric clinic. Discussed that Contrave is not a medication that I prescribe it.

## 2023-01-29 NOTE — Assessment & Plan Note (Signed)
Chronic, inadequate control.  Anxiety symptoms more significant.  Encouraged patient to set up counseling. Will try restarting Wellbutrin XL 150 mg p.o. daily Follow-up with MyChart update in 4 weeks.

## 2023-02-11 ENCOUNTER — Other Ambulatory Visit: Payer: Self-pay

## 2023-02-13 ENCOUNTER — Encounter (HOSPITAL_COMMUNITY): Payer: Self-pay

## 2023-02-13 ENCOUNTER — Other Ambulatory Visit: Payer: Self-pay

## 2023-02-13 NOTE — Progress Notes (Signed)
Specialty Pharmacy Refill Coordination Note  Julie Mckenzie is a 27 y.o. female contacted today regarding refills of specialty medication(s) Secukinumab   Patient requested Delivery   Delivery date: 02/20/23   Verified address: [address] [place], [address]   Medication will be filled on 02/19/23.

## 2023-02-13 NOTE — Progress Notes (Signed)
Specialty Pharmacy Ongoing Clinical Assessment Note  Julie Mckenzie is a 27 y.o. female who is being followed by the specialty pharmacy service for RxSp Hidradenitis Suppurativa   Patient's specialty medication(s) reviewed today: Secukinumab   Missed doses in the last 4 weeks: 0   Patient/Caregiver did not have any additional questions or concerns.   Therapeutic benefit summary: Patient is achieving benefit   Adverse events/side effects summary: No adverse events/side effects   Patient's therapy is appropriate to: Continue    Goals Addressed             This Visit's Progress    Reduce signs and symptoms       Patient is on track. Patient will maintain adherence. Patient states her symptoms remain very well controlled.          Follow up:  6 months  Otto Herb Specialty Pharmacist

## 2023-02-19 ENCOUNTER — Other Ambulatory Visit: Payer: Self-pay

## 2023-03-07 ENCOUNTER — Encounter: Payer: Self-pay | Admitting: Family Medicine

## 2023-03-10 ENCOUNTER — Other Ambulatory Visit: Payer: Self-pay | Admitting: Family Medicine

## 2023-03-10 MED ORDER — SCOPOLAMINE 1 MG/3DAYS TD PT72: 1.0000 | MEDICATED_PATCH | TRANSDERMAL | 0 refills | Status: AC

## 2023-03-11 ENCOUNTER — Other Ambulatory Visit: Payer: Self-pay

## 2023-03-11 ENCOUNTER — Other Ambulatory Visit (HOSPITAL_COMMUNITY): Payer: Self-pay

## 2023-03-11 NOTE — Progress Notes (Signed)
Specialty Pharmacy Refill Coordination Note  Julie Mckenzie is a 27 y.o. female contacted today regarding refills of specialty medication(s) Secukinumab   Patient requested Delivery   Delivery date: 03/20/23   Verified address: [address] [place], [address]   Medication will be filled on 03/19/23.

## 2023-03-12 ENCOUNTER — Encounter (INDEPENDENT_AMBULATORY_CARE_PROVIDER_SITE_OTHER): Payer: Self-pay | Admitting: Internal Medicine

## 2023-03-12 ENCOUNTER — Ambulatory Visit (INDEPENDENT_AMBULATORY_CARE_PROVIDER_SITE_OTHER): Payer: Self-pay | Admitting: Internal Medicine

## 2023-03-12 VITALS — BP 134/82 | HR 104 | Temp 98.0°F | Ht 65.0 in | Wt 207.0 lb

## 2023-03-12 DIAGNOSIS — Z6834 Body mass index (BMI) 34.0-34.9, adult: Secondary | ICD-10-CM | POA: Diagnosis not present

## 2023-03-12 DIAGNOSIS — R638 Other symptoms and signs concerning food and fluid intake: Secondary | ICD-10-CM | POA: Diagnosis not present

## 2023-03-12 DIAGNOSIS — L732 Hidradenitis suppurativa: Secondary | ICD-10-CM | POA: Diagnosis not present

## 2023-03-12 DIAGNOSIS — Z0289 Encounter for other administrative examinations: Secondary | ICD-10-CM

## 2023-03-12 DIAGNOSIS — E66811 Obesity, class 1: Secondary | ICD-10-CM | POA: Insufficient documentation

## 2023-03-12 NOTE — Assessment & Plan Note (Signed)
We reviewed anthropometrics, biometrics, associated medical conditions and contributing factors with patient. she would benefit from a medically tailored reduced calorie nutrional plan based on her REE (resting energy expenditure), which will be determined by indirect calorimetry and baseline labs.  We will also assess for cardiometabolic risk and nutritional derangements via fasting labs at intake appointment.

## 2023-03-12 NOTE — Assessment & Plan Note (Signed)
Possibly related to higher body weight, mechanical stress, hormonal changes.  She is currently on Cosentyx.  Losing weight may improve condition.

## 2023-03-12 NOTE — Assessment & Plan Note (Signed)
She has increased orexigenic signaling and cravings.  This is secondary to an abnormal energy regulation system and pathological neurohormonal pathways characteristic of excess adiposity.  This is also compounded by high levels of stress.  In addition to nutritional and behavioral strategies she may benefit from pharmacotherapy.  This will be explored further at future appointments

## 2023-03-12 NOTE — Progress Notes (Signed)
Office: [phone]  /  Fax: [phone]   Initial Visit  Julie Mckenzie was seen in clinic today to evaluate for obesity. She is interested in losing weight to improve overall health and reduce the risk of weight related complications. She presents today to review program treatment options, initial physical assessment, and evaluation.     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 She states: Improve existing medical conditions and Improve self-confidence  Weight history: started to gain weight since two years ago started nursing school  and caring for father. She was also on medications for migraines.  When asked how has your weight affected you? 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  Current or previous pharmacotherapy: None and Is interested in pharmacotherapy  Response to medication: Never tried medications   Past medical history includes:   Past Medical History:  Diagnosis Date   Acne    Migraine headache without aura 03/01/2014   Motion sickness    car passenger, boats   Post-concussion headache    age 34     Objective:   BP 134/82   Pulse (!) 104   Temp 98 F (36.7 C)   Ht 5\' 5"  (1.651 m)   Wt 207 lb (93.9 kg)   SpO2 100%   BMI 34.45 kg/m  She was weighed on the bioimpedance scale: Body mass index is 34.45 kg/m.  Peak Weight:220 , Body Fat%:39, Visceral Fat  Rating:8, Weight trend over the last 12 months: Decreasing  General:  Alert, oriented and cooperative. Patient is in no acute distress.  Respiratory: Normal respiratory effort, no problems with respiration noted   Gait: able to ambulate independently  Mental Status: Normal mood and affect. Normal behavior. Normal judgment and thought content.   DIAGNOSTIC DATA REVIEWED:  BMET    Component Value Date/Time   NA 138 11/07/2020 0803   K 4.2 11/07/2020 0803   CL 103 11/07/2020 0803   CO2 26 11/07/2020 0803   GLUCOSE 90 11/07/2020 0803   BUN 8 11/07/2020 0803   CREATININE 0.68 11/07/2020 0803   CALCIUM 9.3 11/07/2020 0803   GFRNONAA >60 05/03/2020 2045   GFRAA >90 08/09/2012 0505   Lab Results  Component Value Date   HGBA1C 4.6 11/07/2020   No results found for: "INSULIN" CBC    Component Value Date/Time   WBC 9.4 05/03/2020 2045   RBC 4.79 05/03/2020 2045   HGB 14.4 05/03/2020 2045   HCT 43.9 05/03/2020 2045   PLT 215 05/03/2020 2045   MCV 91.6 05/03/2020 2045   MCH 30.1 05/03/2020 2045   MCHC 32.8 05/03/2020 2045   RDW 12.0 05/03/2020 2045   Iron/TIBC/Ferritin/ %Sat No results found for: "IRON", "TIBC", "FERRITIN", "IRONPCTSAT" Lipid Panel     Component Value Date/Time   CHOL 151 11/07/2020 0803   TRIG 54.0 11/07/2020 0803   HDL 51.90 11/07/2020 0803   CHOLHDL 3 11/07/2020 0803   VLDL 10.8 11/07/2020 0803   LDLCALC 88  11/07/2020 0803   Hepatic Function Panel     Component Value Date/Time   PROT 7.1 11/07/2020 0803   ALBUMIN 4.4 11/07/2020 0803   AST 15 11/07/2020 0803   ALT 17 11/07/2020 0803   ALKPHOS 51 11/07/2020 0803   BILITOT 0.8 11/07/2020 0803      Component Value Date/Time   TSH 3.56 01/29/2023 1231     Assessment and Plan:   Class 1 obesity without serious comorbidity with body mass index (BMI) of 34.0 to 34.9 in adult, unspecified obesity type Assessment & Plan: We reviewed anthropometrics, biometrics, associated medical conditions and  contributing factors with patient. she would benefit from a medically tailored reduced calorie nutrional plan based on her REE (resting energy expenditure), which will be determined by indirect calorimetry and baseline labs.  We will also assess for cardiometabolic risk and nutritional derangements via fasting labs at intake appointment.     Hydradenitis Assessment & Plan: Possibly related to higher body weight, mechanical stress, hormonal changes.  She is currently on Cosentyx.  Losing weight may improve condition.   Abnormal craving Assessment & Plan: She has increased orexigenic signaling and cravings.  This is secondary to an abnormal energy regulation system and pathological neurohormonal pathways characteristic of excess adiposity.  This is also compounded by high levels of stress.  In addition to nutritional and behavioral strategies she may benefit from pharmacotherapy.  This will be explored further at future appointments          Obesity Treatment / Action Plan:  Patient will work on garnering support from family and friends to begin weight loss journey. Will work on eliminating or reducing the presence of highly palatable, calorie dense foods in the home. Will complete provided nutritional and psychosocial assessment questionnaire before the next appointment. Will be scheduled for indirect calorimetry to determine resting energy expenditure in a fasting state.  This will allow [place] to create a reduced calorie, high-protein meal plan to promote loss of fat mass while preserving muscle mass. Counseled on the health benefits of losing 5%-15% of total body weight. Was counseled on nutritional approaches to weight loss and benefits of reducing processed foods and consuming plant-based foods and high quality protein as part of nutritional weight management. Was counseled on pharmacotherapy and role as an adjunct in weight management.   Obesity Education Performed Today:  She was  weighed on the bioimpedance scale and results were discussed and documented in the synopsis.  We discussed obesity as a disease and the importance of a more detailed evaluation of all the factors contributing to the disease.  We discussed the importance of long term lifestyle changes which include nutrition, exercise and behavioral modifications as well as the importance of customizing this to her specific health and social needs.  We discussed the benefits of reaching a healthier weight to alleviate the symptoms of existing conditions and reduce the risks of the biomechanical, metabolic and psychological effects of obesity.  KELSIE STOVES appears to be in the action stage of change and states they are ready to start intensive lifestyle modifications and behavioral modifications.  30 minutes was spent today on this visit including the above counseling, pre-visit chart review, and post-visit documentation.  Reviewed by clinician on day of visit: allergies, medications, problem list, medical history, surgical history, family history, social history, and previous encounter notes pertinent to obesity diagnosis.   Worthy Rancher, MD

## 2023-03-19 ENCOUNTER — Other Ambulatory Visit: Payer: Self-pay

## 2023-04-11 ENCOUNTER — Other Ambulatory Visit (HOSPITAL_COMMUNITY): Payer: Self-pay

## 2023-04-14 ENCOUNTER — Encounter (HOSPITAL_COMMUNITY): Payer: Self-pay

## 2023-04-14 ENCOUNTER — Other Ambulatory Visit (HOSPITAL_COMMUNITY): Payer: Self-pay

## 2023-04-16 ENCOUNTER — Other Ambulatory Visit: Payer: Self-pay

## 2023-04-16 ENCOUNTER — Other Ambulatory Visit (HOSPITAL_COMMUNITY): Payer: Self-pay

## 2023-04-16 NOTE — Progress Notes (Signed)
 Specialty Pharmacy Refill Coordination Note  Julie Mckenzie is a 28 y.o. female contacted today regarding refills of specialty medication(s) Secukinumab  (Cosentyx  UnoReady)   Patient requested Delivery   Delivery date: 04/18/23   Verified address: [name]   [name] [address]   Medication will be filled on 04/17/23.

## 2023-04-17 ENCOUNTER — Other Ambulatory Visit (HOSPITAL_COMMUNITY): Payer: Self-pay

## 2023-04-17 ENCOUNTER — Other Ambulatory Visit: Payer: Self-pay

## 2023-04-17 ENCOUNTER — Ambulatory Visit: Payer: 59 | Attending: Family Medicine | Admitting: Pharmacist

## 2023-04-17 ENCOUNTER — Telehealth: Payer: Self-pay | Admitting: Pharmacist

## 2023-04-17 DIAGNOSIS — Z79899 Other long term (current) drug therapy: Secondary | ICD-10-CM

## 2023-04-17 MED ORDER — COSENTYX UNOREADY 300 MG/2ML ~~LOC~~ SOAJ
SUBCUTANEOUS | 4 refills | Status: DC
  Filled 2023-04-17: qty 2, 28d supply, fill #0

## 2023-04-17 NOTE — Telephone Encounter (Signed)
 Called patient to schedule an appointment for the [facility] Specialty Medication Clinic. I was unable to reach the patient so I left a HIPAA-compliant message requesting that the patient return my call.   Herlene Fleeta Morris, PharmD, JAQUELINE, CPP Clinical Pharmacist [facility] & [facility] [phone]

## 2023-04-17 NOTE — Progress Notes (Addendum)
 Pharmacy Patient Advocate Encounter   Received notification from Patient Pharmacy that prior authorization for Cosentyx  is required/requested.   Insurance verification completed.   The patient is insured through [facility] .   Per test claim: PA required; PA submitted to above mentioned insurance via CoverMyMeds Key/confirmation #/EOC [id number] Status is pending   New plan has a Humira  and Humira  biosimilar step therapy requirement. Submitted for continuation of therapy.  ATC patient, voicemail is full

## 2023-04-17 NOTE — Progress Notes (Signed)
 Patient called back and was told about the PA needed for Cosentyx  and that her medication would not be shipped out today. Patient reported that her next dose of Cosentyx  is due on 04/24/23. Relayed messaged to patient advocate regarding PA. Patient will be called once PA is approved.

## 2023-04-17 NOTE — Progress Notes (Signed)
   S: Patient presents today for review of their specialty medication.   Patient is currently taking Cosentyx  (secukinumab ) for HS. Patient is managed by PA Sharyle Jim for this.   Dosing: 300 mg subcutaneously q28days  Adherence: confirmed   Efficacy: reported that it works well for her  Current adverse effects: S/sx of infection: none  GI upset: none Headache: none  S/sx of hypersensitivity: none     O:     Lab Results  Component Value Date   WBC 9.4 05/03/2020   HGB 14.4 05/03/2020   HCT 43.9 05/03/2020   MCV 91.6 05/03/2020   PLT 215 05/03/2020      Chemistry      Component Value Date/Time   NA 138 11/07/2020 0803   K 4.2 11/07/2020 0803   CL 103 11/07/2020 0803   CO2 26 11/07/2020 0803   BUN 8 11/07/2020 0803   CREATININE 0.68 11/07/2020 0803      Component Value Date/Time   CALCIUM 9.3 11/07/2020 0803   ALKPHOS 51 11/07/2020 0803   AST 15 11/07/2020 0803   ALT 17 11/07/2020 0803   BILITOT 0.8 11/07/2020 0803       A/P: 1. Medication review: Patient is currently on Cosentyx  for HS and is tolerating it well. Reviewed the medication with the patient, including the following: Cosentyx  is a monoclonal antibody used in the treatment of HS, ankylosing spondylitis, psoriasis, and psoriatic arthritis. The injection is subq and the medication should be allowed to reach room temp prior to injecting. Injection sites should be rotated. Possible adverse effects include headaches, GI upset, increased risk of infection and hypersensitivity reactions. No recommendations for any changes at this time.  Herlene Fleeta Morris, PharmD, JAQUELINE, CPP Clinical Pharmacist [facility] & [facility] [phone]

## 2023-04-21 ENCOUNTER — Other Ambulatory Visit (HOSPITAL_COMMUNITY): Payer: Self-pay

## 2023-04-21 ENCOUNTER — Other Ambulatory Visit: Payer: Self-pay

## 2023-04-22 ENCOUNTER — Other Ambulatory Visit (HOSPITAL_COMMUNITY): Payer: Self-pay

## 2023-04-22 ENCOUNTER — Other Ambulatory Visit: Payer: Self-pay

## 2023-04-22 NOTE — Progress Notes (Addendum)
 Pharmacy Patient Advocate Encounter  Received notification from [employer] that Prior Authorization for Cosentyx  has been DENIED.   New plan has a Humira  and Humira  biosimilar step therapy requirement.    PA #/Case ID/Reference #: [id number]   Faxing denial letter to provider. They will have to appeal or change medication.   ATC patient, LVM

## 2023-04-25 ENCOUNTER — Other Ambulatory Visit: Payer: Self-pay

## 2023-04-25 ENCOUNTER — Other Ambulatory Visit (HOSPITAL_COMMUNITY): Payer: Self-pay

## 2023-04-25 ENCOUNTER — Other Ambulatory Visit: Payer: Self-pay | Admitting: Pharmacy Technician

## 2023-04-25 MED ORDER — HUMIRA-CD/UC/HS STARTER 80 MG/0.8ML ~~LOC~~ AJKT
AUTO-INJECTOR | SUBCUTANEOUS | 0 refills | Status: DC
  Filled 2023-05-06: qty 3, 28d supply, fill #0

## 2023-04-25 MED ORDER — HUMIRA (2 PEN) 40 MG/0.4ML ~~LOC~~ AJKT: AUTO-INJECTOR | SUBCUTANEOUS | 5 refills | Status: DC

## 2023-05-06 ENCOUNTER — Other Ambulatory Visit (HOSPITAL_COMMUNITY): Payer: Self-pay

## 2023-05-06 ENCOUNTER — Other Ambulatory Visit: Payer: Self-pay

## 2023-05-06 NOTE — Progress Notes (Signed)
Pt changing to Humira

## 2023-05-06 NOTE — Progress Notes (Signed)
Patient called pharmacy back - Humira starter kit sent in to replace cover Cosentyx requires a PA.  SPPA notified and Initial Outreaches created.   Confirmed patient phone number and will reach back out to patient once Humira is approved.   Patient has an appointment with prescriber on 05/15/23.

## 2023-05-07 ENCOUNTER — Other Ambulatory Visit: Payer: Self-pay

## 2023-05-07 ENCOUNTER — Ambulatory Visit (INDEPENDENT_AMBULATORY_CARE_PROVIDER_SITE_OTHER): Payer: 59 | Admitting: Internal Medicine

## 2023-05-07 ENCOUNTER — Ambulatory Visit: Payer: 59 | Attending: Family Medicine | Admitting: Pharmacist

## 2023-05-07 ENCOUNTER — Other Ambulatory Visit: Payer: Self-pay | Admitting: Pharmacist

## 2023-05-07 ENCOUNTER — Encounter: Payer: Self-pay | Admitting: Pharmacist

## 2023-05-07 ENCOUNTER — Telehealth: Payer: Self-pay | Admitting: Pharmacist

## 2023-05-07 DIAGNOSIS — Z79899 Other long term (current) drug therapy: Secondary | ICD-10-CM

## 2023-05-07 MED ORDER — HUMIRA (2 PEN) 40 MG/0.8ML ~~LOC~~ AJKT
AUTO-INJECTOR | SUBCUTANEOUS | 0 refills | Status: AC
  Filled 2023-05-08: qty 6, fill #0
  Filled 2023-05-08: qty 6, 28d supply, fill #0

## 2023-05-07 MED ORDER — HUMIRA (2 PEN) 40 MG/0.8ML ~~LOC~~ AJKT: AUTO-INJECTOR | SUBCUTANEOUS | 0 refills | Status: DC

## 2023-05-07 MED ORDER — HUMIRA (2 PEN) 40 MG/0.8ML ~~LOC~~ AJKT
AUTO-INJECTOR | SUBCUTANEOUS | 0 refills | Status: DC
  Filled 2023-05-07: qty 4, 28d supply, fill #0

## 2023-05-07 MED ORDER — HUMIRA (2 PEN) 40 MG/0.8ML ~~LOC~~ AJKT
AUTO-INJECTOR | SUBCUTANEOUS | 0 refills | Status: AC
  Filled 2023-05-08: qty 4, fill #0

## 2023-05-07 NOTE — Progress Notes (Signed)
Pharmacy Patient Advocate Encounter   Received notification from Patient Pharmacy that prior authorization for Humira is required/requested.   Insurance verification completed.   The patient is insured through [facility] .   Per test claim: PA required; PA submitted to above mentioned insurance via CoverMyMeds Key/confirmation #/EOC [id number] Status is pending

## 2023-05-07 NOTE — Progress Notes (Signed)
Please see OV from 05/07/2023 for complete documentation.   Butch Penny, PharmD, Patsy [place], CPP Clinical Pharmacist [facility] & [facility] [phone]

## 2023-05-07 NOTE — Telephone Encounter (Signed)
Called patient to schedule an appointment for the [facility] Employee Health Plan Specialty Medication Clinic. I was unable to reach the patient so I left a HIPAA-compliant message requesting that the patient return my call.   Butch Penny, PharmD, Patsy [place], CPP Clinical Pharmacist [facility] & [facility] [phone]

## 2023-05-07 NOTE — Progress Notes (Signed)
  S: Patient presents for review of their specialty medication therapy.  Patient is about to start taking Humira for HS. Patient is managed by Lowry Bowl for this. She was originally prescribed Cosentyx but insurance is requiring trial of Humira first.   Adherence: has not yet started   Efficacy: has not yet started   Dosing:  Hidradenitis suppurativa (Humira only): SubQ: Initial: 160 mg (given as four 40 mg injections on day 1 or given as two 40 mg injections per day over 2 consecutive days), then 80 mg 2 weeks later (day 15). Maintenance: 40 mg every week beginning day 29.  Dose adjustments: Renal: no dose adjustments (has not been studied) Hepatic: no dose adjustments (has not been studied)  Drug-drug interactions: none identified   Screening: TB test: completed  Hepatitis: completed   Monitoring: S/sx of infection: has not yet started  CBC: monitored by her specialist  S/sx of hypersensitivity: has not yet started  S/sx of malignancy: has not yet started  S/sx of heart failure:  has not started   Other side effects: has not started   O:     Lab Results  Component Value Date   WBC 9.4 05/03/2020   HGB 14.4 05/03/2020   HCT 43.9 05/03/2020   MCV 91.6 05/03/2020   PLT 215 05/03/2020      Chemistry      Component Value Date/Time   NA 138 11/07/2020 0803   K 4.2 11/07/2020 0803   CL 103 11/07/2020 0803   CO2 26 11/07/2020 0803   BUN 8 11/07/2020 0803   CREATININE 0.68 11/07/2020 0803      Component Value Date/Time   CALCIUM 9.3 11/07/2020 0803   ALKPHOS 51 11/07/2020 0803   AST 15 11/07/2020 0803   ALT 17 11/07/2020 0803   BILITOT 0.8 11/07/2020 0803       A/P: 1. Medication review: Patient currently on Humira for HS. Reviewed the medication with the patient, including the following: Humira is a TNF blocking agent indicated for ankylosing spondylitis, Crohn's disease, Hidradenitis suppurativa, psoriatic arthritis, plaque psoriasis, ulcerative  colitis, and uveitis. Patient educated on purpose, proper use and potential adverse effects of Humira. Possible adverse effects are increased risk of infections, headache, and injection site reactions. There is the possibility of an increased risk of malignancy but it is not well understood if this increased risk is due to there medication or the disease state. There are rare cases of pancytopenia and aplastic anemia. For SubQ injection at separate sites in the thigh or lower abdomen (avoiding areas within 2 inches of navel); rotate injection sites. May leave at room temperature for ~15 to 30 minutes prior to use; do not remove cap or cover while allowing product to reach room temperature. Do not use if solution is discolored or contains particulate matter. Do not administer to skin which is red, tender, bruised, hard, or that has scars, stretch marks, or psoriasis plaques. Needle cap of the prefilled syringe or needle cover for the adalimumab pen may contain latex. Prefilled pens and syringes are available for use by patients and the full amount of the syringe should be injected (self-administration); the vial is intended for institutional use only. Vials do not contain a preservative; discard unused portion. No recommendations for any changes at this time.  Butch Penny, PharmD, Patsy [place], CPP Clinical Pharmacist [facility] & [facility] [phone]

## 2023-05-08 ENCOUNTER — Other Ambulatory Visit (HOSPITAL_COMMUNITY): Payer: Self-pay

## 2023-05-08 ENCOUNTER — Other Ambulatory Visit: Payer: Self-pay

## 2023-05-08 NOTE — Progress Notes (Signed)
Pharmacy Patient Advocate Encounter  Received notification from [facility] that Prior Authorization for Humira has been APPROVED from 05/07/23 to 06/06/23-loading dose 6 each per 28 days . Humira has been APPROVED from 05/30/23 to 10/27/23-maintenance dose 4 each per 28 days.   PA #/Case ID/Reference #: [id number] [id number]

## 2023-05-08 NOTE — Progress Notes (Signed)
Pharmacy Patient Advocate Encounter  Insurance verification completed.   The patient is insured through [facility]   Ran test claim for Humira. Currently a quantity of 4 is a 28 day supply and the co-pay is $0. Patient has Humira copay card.   This test claim was processed through [facility]- copay amounts may vary at other pharmacies due to pharmacy/plan contracts, or as the patient moves through the different stages of their insurance plan.

## 2023-05-08 NOTE — Progress Notes (Signed)
Specialty Pharmacy Initial Fill Coordination Note  Julie Mckenzie is a 28 y.o. female contacted today regarding initial fill of specialty medication(s) Adalimumab (Humira (2 Pen))   Patient requested Pickup at [facility] date: 05/09/23   Medication will be filled on 1/30.   Patient is aware of $0 copayment.

## 2023-05-13 ENCOUNTER — Other Ambulatory Visit (HOSPITAL_COMMUNITY): Payer: Self-pay

## 2023-05-15 ENCOUNTER — Other Ambulatory Visit (HOSPITAL_COMMUNITY): Payer: Self-pay

## 2023-05-15 DIAGNOSIS — L732 Hidradenitis suppurativa: Secondary | ICD-10-CM | POA: Diagnosis not present

## 2023-05-15 MED ORDER — SPIRONOLACTONE 100 MG PO TABS
100.0000 mg | ORAL_TABLET | Freq: Every day | ORAL | 3 refills | Status: AC
  Filled 2023-05-15: qty 90, 90d supply, fill #0

## 2023-05-21 ENCOUNTER — Ambulatory Visit (INDEPENDENT_AMBULATORY_CARE_PROVIDER_SITE_OTHER): Payer: 59 | Admitting: Internal Medicine

## 2023-05-22 ENCOUNTER — Other Ambulatory Visit: Payer: Self-pay

## 2023-05-27 ENCOUNTER — Other Ambulatory Visit: Payer: Self-pay

## 2023-05-28 ENCOUNTER — Ambulatory Visit (INDEPENDENT_AMBULATORY_CARE_PROVIDER_SITE_OTHER): Payer: 59 | Admitting: Family Medicine

## 2023-05-30 ENCOUNTER — Other Ambulatory Visit: Payer: Self-pay

## 2023-06-02 ENCOUNTER — Other Ambulatory Visit (HOSPITAL_COMMUNITY): Payer: Self-pay

## 2023-06-11 ENCOUNTER — Ambulatory Visit (INDEPENDENT_AMBULATORY_CARE_PROVIDER_SITE_OTHER): Payer: 59 | Admitting: Family Medicine

## 2023-06-19 ENCOUNTER — Encounter (INDEPENDENT_AMBULATORY_CARE_PROVIDER_SITE_OTHER): Payer: Self-pay

## 2023-07-17 ENCOUNTER — Other Ambulatory Visit: Payer: Self-pay

## 2023-08-20 ENCOUNTER — Other Ambulatory Visit: Payer: Self-pay

## 2023-08-26 ENCOUNTER — Other Ambulatory Visit: Payer: Self-pay | Admitting: Family Medicine

## 2023-08-26 NOTE — Telephone Encounter (Signed)
 Lvmtcb, sent mychart message

## 2023-08-26 NOTE — Telephone Encounter (Signed)
Please schedule CPE with fasting labs prior with Dr. Bedsole.  

## 2023-08-27 NOTE — Telephone Encounter (Signed)
 Lvmtcb

## 2023-08-28 NOTE — Telephone Encounter (Signed)
 lvm for pt to call office to schedule appt cpe

## 2023-09-03 ENCOUNTER — Other Ambulatory Visit: Payer: Self-pay

## 2023-09-03 NOTE — Progress Notes (Signed)
 Disenrolling - call center unable to reach patient and medication last filled 1.31.25 (117 days ago).

## 2023-09-23 ENCOUNTER — Other Ambulatory Visit: Payer: Self-pay | Admitting: Family Medicine

## 2023-09-23 NOTE — Telephone Encounter (Signed)
 Spoke to pt, pt states she now has [employer] now has another PCP under ins coverage. Pt states she no longer needs refills from our office. Removed Dr. Cherlyn Cornet as PCP

## 2023-09-23 NOTE — Telephone Encounter (Signed)
 Patient is overdue for CPE with Dr. Cherlyn Cornet. This is required prior to any further refills.

## 2024-05-03 ENCOUNTER — Other Ambulatory Visit: Payer: Self-pay
